# Patient Record
Sex: Female | Born: 1960 | Smoking: Never smoker
Health system: Southern US, Community
[De-identification: ages and names within clinical notes are randomized; demographics above are authoritative.]

## PROBLEM LIST (undated history)

## (undated) DIAGNOSIS — K219 Gastro-esophageal reflux disease without esophagitis: Secondary | ICD-10-CM

## (undated) DIAGNOSIS — Z9889 Other specified postprocedural states: Secondary | ICD-10-CM

## (undated) DIAGNOSIS — R112 Nausea with vomiting, unspecified: Secondary | ICD-10-CM

## (undated) HISTORY — PX: TUBAL LIGATION: SHX77

## (undated) HISTORY — PX: ABDOMINAL HYSTERECTOMY: SHX81

---

## 2005-08-15 HISTORY — PX: ESOPHAGOGASTRODUODENOSCOPY: SHX1529

## 2015-04-13 HISTORY — PX: COLONOSCOPY: SHX174

## 2015-11-09 HISTORY — PX: FLEXIBLE SIGMOIDOSCOPY: SHX5431

## 2016-06-02 DIAGNOSIS — Z8719 Personal history of other diseases of the digestive system: Secondary | ICD-10-CM | POA: Insufficient documentation

## 2017-04-17 HISTORY — PX: FLEXIBLE SIGMOIDOSCOPY: SHX1649

## 2019-04-04 DIAGNOSIS — M6740 Ganglion, unspecified site: Secondary | ICD-10-CM

## 2019-04-04 DIAGNOSIS — R2231 Localized swelling, mass and lump, right upper limb: Secondary | ICD-10-CM

## 2019-04-04 HISTORY — DX: Ganglion, unspecified site: M67.40

## 2019-04-04 HISTORY — DX: Localized swelling, mass and lump, right upper limb: R22.31

## 2019-04-05 ENCOUNTER — Other Ambulatory Visit: Payer: Self-pay | Admitting: Orthopedic Surgery

## 2019-04-11 ENCOUNTER — Other Ambulatory Visit (HOSPITAL_COMMUNITY): Payer: Self-pay

## 2019-04-30 ENCOUNTER — Inpatient Hospital Stay (HOSPITAL_COMMUNITY): Admission: RE | Admit: 2019-04-30 | Payer: Self-pay | Source: Ambulatory Visit

## 2019-05-03 ENCOUNTER — Ambulatory Visit (HOSPITAL_BASED_OUTPATIENT_CLINIC_OR_DEPARTMENT_OTHER): Admit: 2019-05-03 | Payer: Self-pay | Admitting: Orthopedic Surgery

## 2019-05-03 ENCOUNTER — Encounter (HOSPITAL_BASED_OUTPATIENT_CLINIC_OR_DEPARTMENT_OTHER): Payer: Self-pay

## 2019-05-03 SURGERY — CYST REMOVAL
Anesthesia: Regional | Laterality: Right

## 2019-07-15 ENCOUNTER — Other Ambulatory Visit: Payer: Self-pay | Admitting: Orthopedic Surgery

## 2019-07-18 ENCOUNTER — Other Ambulatory Visit (HOSPITAL_COMMUNITY): Payer: BC Managed Care – PPO

## 2019-07-21 ENCOUNTER — Ambulatory Visit (HOSPITAL_BASED_OUTPATIENT_CLINIC_OR_DEPARTMENT_OTHER)
Admission: RE | Admit: 2019-07-21 | Payer: BC Managed Care – PPO | Source: Home / Self Care | Admitting: Orthopedic Surgery

## 2019-07-21 ENCOUNTER — Encounter (HOSPITAL_BASED_OUTPATIENT_CLINIC_OR_DEPARTMENT_OTHER): Admission: RE | Payer: Self-pay | Source: Home / Self Care

## 2019-07-21 SURGERY — Surgical Case
Anesthesia: *Unknown

## 2019-07-21 SURGERY — EXCISION MASS
Anesthesia: Regional | Laterality: Right

## 2019-07-22 ENCOUNTER — Encounter (HOSPITAL_BASED_OUTPATIENT_CLINIC_OR_DEPARTMENT_OTHER): Payer: Self-pay

## 2019-07-22 ENCOUNTER — Other Ambulatory Visit: Payer: Self-pay

## 2019-07-22 NOTE — Progress Notes (Signed)
Pt states she is very hard IV stick when NPO for past procedures. Reviewed with Dr. Gifford Shave, and states that pt is allowed to drink water up until 10 AM on DOS. Ensure is to finished by 10 as well. Pt instructed to call back if surgery time is rescheduled for any reason, to make sure NPO status is still adequate for surgery. Pt verbalized understanding.

## 2019-07-26 ENCOUNTER — Other Ambulatory Visit (HOSPITAL_COMMUNITY)
Admission: RE | Admit: 2019-07-26 | Discharge: 2019-07-26 | Disposition: A | Discharge status: Home / Self Care | Payer: BC Managed Care – PPO | Source: Ambulatory Visit | Attending: Orthopedic Surgery | Admitting: Orthopedic Surgery

## 2019-07-26 DIAGNOSIS — Z20828 Contact with and (suspected) exposure to other viral communicable diseases: Secondary | ICD-10-CM | POA: Diagnosis not present

## 2019-07-26 DIAGNOSIS — Z01812 Encounter for preprocedural laboratory examination: Secondary | ICD-10-CM | POA: Diagnosis not present

## 2019-07-26 NOTE — Progress Notes (Signed)

## 2019-07-27 LAB — NOVEL CORONAVIRUS, NAA (HOSP ORDER, SEND-OUT TO REF LAB; TAT 18-24 HRS): SARS-CoV-2, NAA: NOT DETECTED

## 2019-07-29 ENCOUNTER — Encounter (HOSPITAL_BASED_OUTPATIENT_CLINIC_OR_DEPARTMENT_OTHER)
Admission: RE | Disposition: A | Discharge status: Home / Self Care | Payer: Self-pay | Source: Home / Self Care | Attending: Orthopedic Surgery

## 2019-07-29 ENCOUNTER — Other Ambulatory Visit: Payer: Self-pay

## 2019-07-29 ENCOUNTER — Encounter (HOSPITAL_BASED_OUTPATIENT_CLINIC_OR_DEPARTMENT_OTHER): Payer: Self-pay | Admitting: *Deleted

## 2019-07-29 ENCOUNTER — Ambulatory Visit (HOSPITAL_BASED_OUTPATIENT_CLINIC_OR_DEPARTMENT_OTHER): Payer: BC Managed Care – PPO | Admitting: Certified Registered"

## 2019-07-29 ENCOUNTER — Ambulatory Visit (HOSPITAL_BASED_OUTPATIENT_CLINIC_OR_DEPARTMENT_OTHER)
Admission: RE | Admit: 2019-07-29 | Discharge: 2019-07-29 | Disposition: A | Discharge status: Home / Self Care | Payer: BC Managed Care – PPO | Attending: Orthopedic Surgery | Admitting: Orthopedic Surgery

## 2019-07-29 DIAGNOSIS — M67441 Ganglion, right hand: Secondary | ICD-10-CM | POA: Insufficient documentation

## 2019-07-29 DIAGNOSIS — M19041 Primary osteoarthritis, right hand: Secondary | ICD-10-CM | POA: Insufficient documentation

## 2019-07-29 DIAGNOSIS — E119 Type 2 diabetes mellitus without complications: Secondary | ICD-10-CM | POA: Diagnosis not present

## 2019-07-29 DIAGNOSIS — K219 Gastro-esophageal reflux disease without esophagitis: Secondary | ICD-10-CM | POA: Diagnosis not present

## 2019-07-29 DIAGNOSIS — M25741 Osteophyte, right hand: Secondary | ICD-10-CM | POA: Insufficient documentation

## 2019-07-29 DIAGNOSIS — R2231 Localized swelling, mass and lump, right upper limb: Secondary | ICD-10-CM | POA: Diagnosis present

## 2019-07-29 HISTORY — DX: Nausea with vomiting, unspecified: R11.2

## 2019-07-29 HISTORY — DX: Gastro-esophageal reflux disease without esophagitis: K21.9

## 2019-07-29 HISTORY — PX: I & D EXTREMITY: SHX5045

## 2019-07-29 HISTORY — PX: CYST EXCISION: SHX5701

## 2019-07-29 HISTORY — DX: Other specified postprocedural states: Z98.890

## 2019-07-29 SURGERY — IRRIGATION AND DEBRIDEMENT EXTREMITY
Anesthesia: Monitor Anesthesia Care | Site: Hand | Laterality: Right

## 2019-07-29 MED ORDER — TRAMADOL HCL 50 MG PO TABS: 50.0000 mg | ORAL_TABLET | Freq: Four times a day (QID) | ORAL | 0 refills | Status: DC | PRN

## 2019-07-29 MED ORDER — CEFAZOLIN SODIUM-DEXTROSE 2-4 GM/100ML-% IV SOLN
INTRAVENOUS | Status: AC
  Filled 2019-07-29: qty 100

## 2019-07-29 MED ORDER — LIDOCAINE HCL (PF) 0.5 % IJ SOLN
INTRAMUSCULAR | Status: DC | PRN
  Administered 2019-07-29: 30 mL via INTRAVENOUS

## 2019-07-29 MED ORDER — PROPOFOL 500 MG/50ML IV EMUL
INTRAVENOUS | Status: DC | PRN
  Administered 2019-07-29: 100 ug/kg/min via INTRAVENOUS

## 2019-07-29 MED ORDER — SCOPOLAMINE 1 MG/3DAYS TD PT72
MEDICATED_PATCH | TRANSDERMAL | Status: AC
  Filled 2019-07-29: qty 1

## 2019-07-29 MED ORDER — MIDAZOLAM HCL 5 MG/5ML IJ SOLN
INTRAMUSCULAR | Status: DC | PRN
  Administered 2019-07-29: 2 mg via INTRAVENOUS

## 2019-07-29 MED ORDER — 0.9 % SODIUM CHLORIDE (POUR BTL) OPTIME
TOPICAL | Status: DC | PRN
  Administered 2019-07-29: 15:00:00 1000 mL

## 2019-07-29 MED ORDER — LIDOCAINE HCL (PF) 1 % IJ SOLN
INTRAMUSCULAR | Status: DC | PRN
  Administered 2019-07-29: 30 mL

## 2019-07-29 MED ORDER — ONDANSETRON HCL 4 MG/2ML IJ SOLN
INTRAMUSCULAR | Status: DC | PRN
  Administered 2019-07-29: 4 mg via INTRAVENOUS

## 2019-07-29 MED ORDER — FENTANYL CITRATE (PF) 100 MCG/2ML IJ SOLN
INTRAMUSCULAR | Status: DC | PRN
  Administered 2019-07-29: 50 ug via INTRAVENOUS

## 2019-07-29 MED ORDER — CEFAZOLIN SODIUM-DEXTROSE 2-4 GM/100ML-% IV SOLN
2.0000 g | INTRAVENOUS | Status: AC
  Administered 2019-07-29: 14:00:00 2 g via INTRAVENOUS

## 2019-07-29 MED ORDER — BUPIVACAINE HCL (PF) 0.25 % IJ SOLN
INTRAMUSCULAR | Status: DC | PRN
  Administered 2019-07-29: 30 mL

## 2019-07-29 MED ORDER — CHLORHEXIDINE GLUCONATE 4 % EX LIQD: 60.0000 mL | Freq: Once | CUTANEOUS | Status: DC

## 2019-07-29 MED ORDER — FENTANYL CITRATE (PF) 100 MCG/2ML IJ SOLN
INTRAMUSCULAR | Status: AC
  Filled 2019-07-29: qty 2

## 2019-07-29 MED ORDER — EPHEDRINE SULFATE 50 MG/ML IJ SOLN
INTRAMUSCULAR | Status: DC | PRN
  Administered 2019-07-29: 5 mg via INTRAVENOUS

## 2019-07-29 MED ORDER — MIDAZOLAM HCL 2 MG/2ML IJ SOLN
INTRAMUSCULAR | Status: AC
  Filled 2019-07-29: qty 2

## 2019-07-29 MED ORDER — ONDANSETRON HCL 4 MG/2ML IJ SOLN
INTRAMUSCULAR | Status: AC
  Filled 2019-07-29: qty 2

## 2019-07-29 MED ORDER — SCOPOLAMINE 1 MG/3DAYS TD PT72
1.0000 | MEDICATED_PATCH | Freq: Once | TRANSDERMAL | Status: DC
  Administered 2019-07-29: 1.5 mg via TRANSDERMAL

## 2019-07-29 MED ORDER — LACTATED RINGERS IV SOLN
INTRAVENOUS | Status: DC
  Administered 2019-07-29: 13:00:00 via INTRAVENOUS

## 2019-07-29 SURGICAL SUPPLY — 53 items
BAG DECANTER FOR FLEXI CONT (MISCELLANEOUS) IMPLANT
BLADE MINI RND TIP GREEN BEAV (BLADE) IMPLANT
BLADE SURG 15 STRL LF DISP TIS (BLADE) ×1 IMPLANT
BLADE SURG 15 STRL SS (BLADE) ×2
BNDG COHESIVE 1X5 TAN STRL LF (GAUZE/BANDAGES/DRESSINGS) IMPLANT
BNDG COHESIVE 2X5 TAN STRL LF (GAUZE/BANDAGES/DRESSINGS) IMPLANT
BNDG COHESIVE 3X5 TAN STRL LF (GAUZE/BANDAGES/DRESSINGS) IMPLANT
BNDG ESMARK 4X9 LF (GAUZE/BANDAGES/DRESSINGS) IMPLANT
BNDG GAUZE ELAST 4 BULKY (GAUZE/BANDAGES/DRESSINGS) IMPLANT
CHLORAPREP W/TINT 26 (MISCELLANEOUS) ×3 IMPLANT
CORD BIPOLAR FORCEPS 12FT (ELECTRODE) ×3 IMPLANT
COVER BACK TABLE REUSABLE LG (DRAPES) ×3 IMPLANT
COVER MAYO STAND REUSABLE (DRAPES) ×3 IMPLANT
COVER WAND RF STERILE (DRAPES) IMPLANT
CUFF TOURN SGL QUICK 18X4 (TOURNIQUET CUFF) IMPLANT
DECANTER SPIKE VIAL GLASS SM (MISCELLANEOUS) IMPLANT
DRAIN PENROSE 1/2X12 LTX STRL (WOUND CARE) IMPLANT
DRAPE EXTREMITY T 121X128X90 (DISPOSABLE) ×3 IMPLANT
DRAPE SURG 17X23 STRL (DRAPES) ×3 IMPLANT
GAUZE PACKING IODOFORM 1/4X15 (GAUZE/BANDAGES/DRESSINGS) IMPLANT
GAUZE SPONGE 4X4 12PLY STRL (GAUZE/BANDAGES/DRESSINGS) ×3 IMPLANT
GAUZE XEROFORM 1X8 LF (GAUZE/BANDAGES/DRESSINGS) ×3 IMPLANT
GLOVE BIOGEL PI IND STRL 8.5 (GLOVE) ×1 IMPLANT
GLOVE BIOGEL PI INDICATOR 8.5 (GLOVE) ×2
GLOVE SURG ORTHO 8.0 STRL STRW (GLOVE) ×3 IMPLANT
GOWN STRL REUS W/ TWL LRG LVL3 (GOWN DISPOSABLE) ×1 IMPLANT
GOWN STRL REUS W/TWL LRG LVL3 (GOWN DISPOSABLE) ×2
GOWN STRL REUS W/TWL XL LVL3 (GOWN DISPOSABLE) ×3 IMPLANT
LOOP VESSEL MAXI BLUE (MISCELLANEOUS) IMPLANT
NEEDLE PRECISIONGLIDE 27X1.5 (NEEDLE) IMPLANT
NS IRRIG 1000ML POUR BTL (IV SOLUTION) ×3 IMPLANT
PACK BASIN DAY SURGERY FS (CUSTOM PROCEDURE TRAY) ×3 IMPLANT
PAD CAST 3X4 CTTN HI CHSV (CAST SUPPLIES) IMPLANT
PADDING CAST ABS 3INX4YD NS (CAST SUPPLIES)
PADDING CAST ABS 4INX4YD NS (CAST SUPPLIES) ×2
PADDING CAST ABS COTTON 3X4 (CAST SUPPLIES) IMPLANT
PADDING CAST ABS COTTON 4X4 ST (CAST SUPPLIES) ×1 IMPLANT
PADDING CAST COTTON 3X4 STRL (CAST SUPPLIES)
SPLINT FINGER 3.25 911903 (SOFTGOODS) ×3 IMPLANT
SPLINT PLASTER CAST XFAST 3X15 (CAST SUPPLIES) IMPLANT
SPLINT PLASTER XTRA FASTSET 3X (CAST SUPPLIES)
STOCKINETTE 4X48 STRL (DRAPES) ×3 IMPLANT
SUT ETHILON 4 0 PS 2 18 (SUTURE) IMPLANT
SUT ETHILON 5 0 P 3 18 (SUTURE) ×4
SUT NYLON ETHILON 5-0 P-3 1X18 (SUTURE) ×2 IMPLANT
SUT VIC AB 4-0 P2 18 (SUTURE) IMPLANT
SWAB COLLECTION DEVICE MRSA (MISCELLANEOUS) IMPLANT
SWAB CULTURE ESWAB REG 1ML (MISCELLANEOUS) IMPLANT
SYR BULB 3OZ (MISCELLANEOUS) ×3 IMPLANT
SYR CONTROL 10ML LL (SYRINGE) IMPLANT
TOWEL GREEN STERILE FF (TOWEL DISPOSABLE) ×6 IMPLANT
TUBE FEEDING ENTERAL 5FR 16IN (TUBING) IMPLANT
UNDERPAD 30X36 HEAVY ABSORB (UNDERPADS AND DIAPERS) ×3 IMPLANT

## 2019-07-29 NOTE — Brief Op Note (Signed)
07/29/2019  2:50 PM  PATIENT:  Anne Cook  58 y.o. female  PRE-OPERATIVE DIAGNOSIS:  MUCOID CYST RIGHT SMALL FINGER  POST-OPERATIVE DIAGNOSIS:  MUCOID CYST RIGHT SMALL FINGER  PROCEDURE:  Procedure(s): IRRIGATION AND DEBRIDEMENT EXTREMITY RIGHT SMALL FINGER (Right) CYST REMOVAL (Right)  SURGEON:  Surgeon(s) and Role:    Daryll Brod, MD - Primary  PHYSICIAN ASSISTANT:   ASSISTANTS: none   ANESTHESIA:   local, regional and IV sedation  EBL:  5 mL   BLOOD ADMINISTERED:none  DRAINS: none   LOCAL MEDICATIONS USED:  BUPIVICAINE  and XYLOCAINE   SPECIMEN:  Excision  DISPOSITION OF SPECIMEN:  PATHOLOGY  COUNTS:  YES  TOURNIQUET:   Total Tourniquet Time Documented: Forearm (Right) - 23 minutes Total: Forearm (Right) - 23 minutes   DICTATION: .Viviann Spare Dictation  PLAN OF CARE: Discharge to home after PACU  PATIENT DISPOSITION:  PACU - hemodynamically stable.

## 2019-07-29 NOTE — Op Note (Signed)
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   ASSISTANT: none   ANESTHESIA:  Bier block with sedation and Local   INTRAVENOUS FLUIDS:  Per anesthesia flow sheet.   ESTIMATED BLOOD LOSS:  Minimal.   COMPLICATIONS:  None.   SPECIMENS:   Cyst synovial tissue and osteophytes   TOURNIQUET TIME:    Total Tourniquet Time Documented: Forearm (Right) - 23 minutes Total: Forearm (Right) - 23 minutes    DISPOSITION:  Stable to PACU.   INDICATIONS: Patient is a 58 year old female with a history of mass dorsal aspect distal phalangeal joint right small finger.  This is included but still has a cystic remnant.  Arthritis present distal phalangeal joint ~like to have this surgically excised with debridement of the joint.  Preperi-and postoperative course been discussed along with risk complications.  She is aware there is no guarantee to the surgery the possibility of infection recurrence injury to arteries nerves tendons complete relief of symptoms just possibility of recurrence.  In the preoperative area the patient seen the extremity marked by palpation surgeon antibiotic given  OPERATIVE COURSE: Patient was brought to the operating room forearm-based IV regional anesthetic was carried out without difficulty.  She was prepped using ChloraPrep in the supine position with the right arm free.  A 3-minute dry time was allowed and a timeout taken to confirm patient procedure.  A metacarpal block was given quarter percent 1%  Xylocaine and no 50-50 solution.  A curvilinear incision was made over the distal phalangeal joint carried along the mid lateral line.  Down through subcutaneous tissue in the right small finger.  Bleeders were electrocauterized necessary with bipolar.  The skin distally was undermined the cyst was isolated with a house curettes and a hemostatic rondure.  This was then removed and sent to pathology.  The joint was opened on its radial aspect just volar to the extensor tendon.  Synovectomy performed dorsally and osteophytes removed from the middle phalanx with using the hemostatic rondure and house curette.  Specimen was sent to pathology.  Wound was copiously irrigated with saline.  The skin was then closed with interrupted 5-0 nylon sutures.  A sterile compressive dressing splint to the finger was applied.  Tourniquet was deflated remaining fingers pink.  She was taken to the recovery room for observation in satisfactory condition.  She will be discharged home to return [place] in 1week Tylenol ibuprofen for pain with Ultram as a backup for breakthrough.   Daryll Brod, MD Electronically signed, 07/29/19

## 2019-07-29 NOTE — Discharge Instructions (Addendum)

## 2019-07-29 NOTE — Anesthesia Procedure Notes (Signed)
Anesthesia Regional Block: Bier block (IV Regional)   Pre-Anesthetic Checklist: ,, timeout performed, Correct Patient, Correct Site, Correct Laterality, Correct Procedure, Correct Position, site marked, Risks and benefits discussed, Surgical consent,  Pre-op evaluation,  At surgeon's request  Laterality: Right  Prep: alcohol swabs       Needles:       Needle Gauge: 22     Additional Needles:   Procedures:,,,,,,,, #20gu IV placed  Narrative:  Anesthesiologist: Lillia Abed, MD CRNA: Verita Lamb, CRNA

## 2019-07-29 NOTE — H&P (Signed)
  Anne Cook is an 58 y.o. female.   Chief Complaint:mass right small fingerHPI:Anne Cook is a 58 year old right-hand-dominant female referred by Dr. Alecia Lemming for consultation regarding a mass on the dorsal aspect of her right small finger. This has been present for approximately 3 months. Recalls no history of injury it does not cause any pain it is enlarged. Is not had any treatment for it. Nothing makes it better or worse. She has history of diabetes thyroid problems arthritis gout. Family history is positive arthritis negative for the remainder.   Past Medical History:  Diagnosis Date  . GERD (gastroesophageal reflux disease)   . PONV (postoperative nausea and vomiting)    hx of N/V with tubal    Past Surgical History:  Procedure Laterality Date  . ABDOMINAL HYSTERECTOMY    . TUBAL LIGATION      History reviewed. No pertinent family history. Social History:  reports that she has never smoked. She has never used smokeless tobacco. She reports previous alcohol use. She reports that she does not use drugs.  Allergies:  Allergies  Allergen Reactions  . Amoxicillin Hives    Thrush   . Latex Rash    No medications prior to admission.    No results found for this or any previous visit (from the past 48 hour(s)).  No results found.   Pertinent items are noted in HPI.  Height 5\' 10"  (1.778 m), weight 61.2 kg.  General appearance: alert, cooperative and appears stated age Head: Normocephalic, without obvious abnormality Neck: no JVD Resp: clear to auscultation bilaterally Cardio: regular rate and rhythm, S1, S2 normal, no murmur, click, rub or gallop GI: soft, non-tender; bowel sounds normal; no masses,  no organomegaly Extremities: mass right small finger Pulses: 2+ and symmetric Skin: Skin color, texture, turgor normal. No rashes or lesions Neurologic: Grossly normal Incision/Wound: na  Assessment/Plan Diagnosis mucoid cyst degenerative arthritis distal  phalangeal joint right small finger   Plan: Have discussed with her the etiology of the problem various treatment alternatives. Would recommend excision of the cyst debridement of the distal phalangeal joint. Pre-peri-postoperative course been discussed along with risk and complications. She is aware there is no guarantee to the surgery possibility of infection recurrence injury to arteries nerves tendons complete relief symptoms dystrophy she is aware that as long as the joint is present of a new cyst can form. She would like to proceed and is scheduled for excision mucoid cyst debridement distal phalangeal joint right small finger as an outpatient under regional anesthesia.   Daryll Brod 07/29/2019, 5:37 AM

## 2019-07-29 NOTE — Transfer of Care (Signed)
Immediate Anesthesia Transfer of Care Note  Patient: Anne Cook  Procedure(s) Performed: IRRIGATION AND DEBRIDEMENT EXTREMITY RIGHT SMALL FINGER (Right Hand) CYST REMOVAL (Right Hand)  Patient Location: PACU  Anesthesia Type:MAC and Bier block  Level of Consciousness: awake, alert  and oriented  Airway & Oxygen Therapy: Patient Spontanous Breathing and Patient connected to face mask oxygen  Post-op Assessment: Report given to RN and Post -op Vital signs reviewed and stable  Post vital signs: Reviewed and stable  Last Vitals:  Vitals Value Taken Time  BP 119/58 07/29/19 1453  Temp    Pulse 64 07/29/19 1454  Resp 16 07/29/19 1454  SpO2 100 % 07/29/19 1454  Vitals shown include unvalidated device data.  Last Pain:  Vitals:   07/29/19 1254  TempSrc: Oral  PainSc: 0-No pain      Patients Stated Pain Goal: 0 (16/10/96 0454)  Complications: No apparent anesthesia complications

## 2019-07-29 NOTE — Anesthesia Postprocedure Evaluation (Signed)
Anesthesia Post Note  Patient: Anne Cook  Procedure(s) Performed: IRRIGATION AND DEBRIDEMENT EXTREMITY RIGHT SMALL FINGER (Right Hand) CYST REMOVAL (Right Hand)     Patient location during evaluation: PACU Anesthesia Type: MAC Level of consciousness: awake and alert and patient cooperative Pain management: pain level controlled Vital Signs Assessment: post-procedure vital signs reviewed and stable Respiratory status: spontaneous breathing and respiratory function stable Cardiovascular status: stable Anesthetic complications: no    Last Vitals:  Vitals:   07/29/19 1254 07/29/19 1453  BP: 137/81 (!) 119/58  Pulse: 64 62  Resp: 18 15  Temp: 37.2 C 36.5 C  SpO2: 100% 100%    Last Pain:  Vitals:   07/29/19 1500  TempSrc:   PainSc: 0-No pain                 Laval Cafaro DAVID

## 2019-07-29 NOTE — Anesthesia Preprocedure Evaluation (Signed)
Anesthesia Evaluation  Patient identified by MRN, date of birth, ID band Patient awake    Reviewed: Allergy & Precautions, NPO status , Patient's Chart, lab work & pertinent test results  History of Anesthesia Complications (+) PONV  Airway Mallampati: I  TM Distance: >3 FB Neck ROM: Full    Dental   Pulmonary    Pulmonary exam normal        Cardiovascular Normal cardiovascular exam     Neuro/Psych    GI/Hepatic GERD  Medicated and Controlled,  Endo/Other    Renal/GU      Musculoskeletal   Abdominal   Peds  Hematology   Anesthesia Other Findings   Reproductive/Obstetrics                             Anesthesia Physical Anesthesia Plan  ASA: II  Anesthesia Plan: MAC   Post-op Pain Management:    Induction: Intravenous  PONV Risk Score and Plan: 3 and Treatment may vary due to age or medical condition, Ondansetron and Midazolam  Airway Management Planned: Simple Face Mask  Additional Equipment:   Intra-op Plan:   Post-operative Plan:   Informed Consent: I have reviewed the patients History and Physical, chart, labs and discussed the procedure including the risks, benefits and alternatives for the proposed anesthesia with the patient or authorized representative who has indicated his/her understanding and acceptance.       Plan Discussed with: CRNA and Surgeon  Anesthesia Plan Comments:         Anesthesia Quick Evaluation

## 2019-08-01 ENCOUNTER — Encounter (HOSPITAL_BASED_OUTPATIENT_CLINIC_OR_DEPARTMENT_OTHER): Payer: Self-pay | Admitting: Orthopedic Surgery

## 2019-08-01 LAB — SURGICAL PATHOLOGY

## 2020-02-01 ENCOUNTER — Ambulatory Visit: Payer: BC Managed Care – PPO | Admitting: Sports Medicine

## 2020-02-01 ENCOUNTER — Encounter: Payer: Self-pay | Admitting: Sports Medicine

## 2020-02-01 ENCOUNTER — Other Ambulatory Visit: Payer: Self-pay

## 2020-02-01 ENCOUNTER — Other Ambulatory Visit: Payer: Self-pay | Admitting: Sports Medicine

## 2020-02-01 ENCOUNTER — Ambulatory Visit (INDEPENDENT_AMBULATORY_CARE_PROVIDER_SITE_OTHER): Payer: BC Managed Care – PPO

## 2020-02-01 DIAGNOSIS — M778 Other enthesopathies, not elsewhere classified: Secondary | ICD-10-CM | POA: Diagnosis not present

## 2020-02-01 DIAGNOSIS — M779 Enthesopathy, unspecified: Secondary | ICD-10-CM | POA: Diagnosis not present

## 2020-02-01 DIAGNOSIS — M79671 Pain in right foot: Secondary | ICD-10-CM

## 2020-02-01 NOTE — Progress Notes (Signed)
Subjective: Anne Cook is a 59 y.o. female patient who presents to office for evaluation of right foot pain. Patient complains of a little soreness on the lateral side of her right foot for the last 6 months reports that she does a lot of hiking and biking does not recall any type of injury but does notice that she has pain after the end of her day at work or after activity reports that she also noticed some pain when she goes from sitting to standing up on the lateral side of the right foot pain is 2 out of 10 has tried icing and also has not noticed a difference when she has changed her shoes which she does on a regular basis due to her increased mileage with hiking.  No other pedal complaints noted.  Review of Systems  All other systems reviewed and are negative.     Patient Active Problem List   Diagnosis Date Noted  . Mass of finger of right hand 04/04/2019  . Mucoid cyst, joint 04/04/2019  . History of gastroesophageal reflux (GERD) 06/02/2016    Current Outpatient Medications on File Prior to Visit  Medication Sig Dispense Refill  . Melatonin 1 MG CAPS Take 1 capsule by mouth at bedtime.    . Multiple Vitamin (MULTIVITAMIN) capsule Take 1 capsule by mouth every other day.    Marland Kitchen omeprazole (PRILOSEC) 20 MG capsule Take 20 mg by mouth daily.    . vitamin B-12 (CYANOCOBALAMIN) 100 MCG tablet Take 100 mcg by mouth every other day.     No current facility-administered medications on file prior to visit.    Allergies  Allergen Reactions  . Amoxicillin Hives    Thrush   . Latex Rash    Objective:  General: Alert and oriented x3 in no acute distress  Dermatology: No open lesions bilateral lower extremities, no webspace macerations, no ecchymosis bilateral, all nails x 10 are well manicured.  Vascular: Dorsalis Pedis and Posterior Tibial pedal pulses palpable, Capillary Fill Time 3 seconds,(+) pedal hair growth bilateral, no edema bilateral lower extremities, Temperature  gradient within normal limits.  Neurology: Michaell Cowing sensation intact via light touch bilateral.  Musculoskeletal: Mild tenderness with palpation at right fifth metatarsal base at peroneal brevis insertion, mild hypermobility noted along the first ray with enlarged base/dorsal prominence at midfoot cuneiform joint right greater than left,No pain with calf compression bilateral, range of motion within normal limits bilateral except as mentioned above with hypermobility.  Strength within normal limits in all groups bilateral.   Xrays  Right foot   Impression: No fracture or dislocation, hypermobile foot type, increased prominence at the mid cuneiform base however no major signs of arthritis, soft tissue swelling within normal limits.  No other acute findings.  Assessment and Plan: Problem List Items Addressed This Visit    None    Visit Diagnoses    Tendinitis    -  Primary   Right foot pain           -Complete examination performed -Xrays reviewed -Discussed treatment options for likely tendinitis -Patient was recommended to have a ankle gauntlet for her Tri-Lock brace patient was fitted for both braces this visit to use while biking or hiking however at this time patient declined to take them -Recommend rest ice elevation and continue with good supportive shoes -Advised patient to continue activities to tolerance with protection -Advised topical anti-inflammatory of pain patch as needed -Patient to return to office as needed or sooner if  condition worsens.  Landis Martins, DPM

## 2020-02-07 ENCOUNTER — Telehealth: Payer: Self-pay

## 2020-02-07 DIAGNOSIS — M79671 Pain in right foot: Secondary | ICD-10-CM

## 2020-02-07 DIAGNOSIS — M779 Enthesopathy, unspecified: Secondary | ICD-10-CM

## 2020-02-07 NOTE — Telephone Encounter (Signed)
PT order for [facility] Val can you help [place] with sending a order for PT for this patient since we have never use [facility] before THanks Dr. Kathie Rhodes

## 2020-02-07 NOTE — Telephone Encounter (Signed)
Pt called requesting a PT order for [facility] for her pain.

## 2020-02-08 NOTE — Telephone Encounter (Signed)
Faxed required form, clinical and demographics to [facility].

## 2020-02-08 NOTE — Telephone Encounter (Signed)
Thank you Valery! :)

## 2020-02-08 NOTE — Addendum Note (Signed)
Addended by: Alphia Kava D on: 02/08/2020 09:36 AM   Modules accepted: Orders

## 2020-02-10 ENCOUNTER — Other Ambulatory Visit: Payer: Self-pay | Admitting: Sports Medicine

## 2020-02-10 DIAGNOSIS — M779 Enthesopathy, unspecified: Secondary | ICD-10-CM

## 2020-10-29 DIAGNOSIS — K219 Gastro-esophageal reflux disease without esophagitis: Secondary | ICD-10-CM | POA: Insufficient documentation

## 2020-10-29 DIAGNOSIS — Z9889 Other specified postprocedural states: Secondary | ICD-10-CM | POA: Insufficient documentation

## 2020-10-31 ENCOUNTER — Encounter: Payer: Self-pay | Admitting: Cardiology

## 2020-10-31 ENCOUNTER — Encounter: Payer: Self-pay | Admitting: *Deleted

## 2020-10-31 DIAGNOSIS — E785 Hyperlipidemia, unspecified: Secondary | ICD-10-CM

## 2020-10-31 HISTORY — DX: Hyperlipidemia, unspecified: E78.5

## 2020-11-02 NOTE — Progress Notes (Signed)
Cardiology Office Note:    Date:  11/05/2020   ID:  [name], DOB [birth year], MRN [id number]  PCP:  Lise Auer, MD  Cardiologist:  Norman Herrlich, MD   Referring MD: Lise Auer, MD  ASSESSMENT:    1. Cardiac risk counseling   2. Pure hypercholesterolemia   3. Family history of early CAD    PLAN:    In order of problems listed above:  1. Her CAD risk is low from the ASCVD calculator however it does not take into consideration family history.  After discussion we will do a cardiac calcium score to recalculate her risk if high risk of statin is absolutely necessary for intermediates of personal decision and she had high risk score I will consider the merits of a cardiac CTA. 2. We will make a decision to follow-up after seeing the results of the calcium score  Next appointment to be determined   Medication Adjustments/Labs and Tests Ordered: Current medicines are reviewed at length with the patient today.  Concerns regarding medicines are outlined above.  No orders of the defined types were placed in this encounter.  No orders of the defined types were placed in this encounter.    No chief complaint on file.   History of Present Illness:    Anne Cook is a 60 y.o. female who is being seen today for the evaluation of family history of CAD at the request of Lise Auer, MD. I reviewed epic and care everywhere and there were no cardiac notes or imaging reports available. Most recent labs 09/04/2020: Cholesterol 217 LDL 150 triglycerides 123 HDL 42 non-HDL cholesterol quite elevated 165.  Hemoglobin 13.4 01/31/2020 CMP shows potassium 4.2 GFR 80 cc normal liver function test  She has a strong family history of CAD Father died age 67 following myocardial infarction Paternal grandfather died age 81 was told was heart Maternal grandmother died later in life unspecified heart disease She has a history of previous chest pain and a normal treadmill EKG in  2017. She has moderately elevated lipids and is concerned about her personal risk and whether or not she should except a statin is hesitant to take 1. From the ASCVD calculator her risk is low 2.5%. She has no history of hypertension diabetes cigarette smoking.  Past Medical History:  Diagnosis Date  . GERD (gastroesophageal reflux disease)   . Hyperlipidemia 10/31/2020  . Mass of finger of right hand 04/04/2019  . Mucoid cyst, joint 04/04/2019  . PONV (postoperative nausea and vomiting)    hx of N/V with tubal    Past Surgical History:  Procedure Laterality Date  . ABDOMINAL HYSTERECTOMY    . CYST EXCISION Right 07/29/2019   Procedure: CYST REMOVAL;  Surgeon: Cindee Salt, MD;  Location: [facility];  Service: Orthopedics;  Laterality: Right;  . I & D EXTREMITY Right 07/29/2019   Procedure: IRRIGATION AND DEBRIDEMENT EXTREMITY RIGHT SMALL FINGER;  Surgeon: Cindee Salt, MD;  Location: [facility];  Service: Orthopedics;  Laterality: Right;  . TUBAL LIGATION      Current Medications: Current Meds  Medication Sig  . Biotin 1000 MCG CHEW Chew 1 tablet by mouth 2 (two) times a week.  . Chromium Picolinate (CHROMIUM PICOLATE PO) Take 1 capsule by mouth once a week.  . loratadine (CLARITIN) 10 MG tablet Take 10 mg by mouth daily.  . metroNIDAZOLE (METROGEL) 1 % gel Apply 1 application topically daily. Apply daily on nose  .  Multiple Vitamin (MULTIVITAMIN) capsule Take 1 capsule by mouth every other day.  Marland Kitchen omeprazole (PRILOSEC) 20 MG capsule Take 20 mg by mouth daily.  . vitamin B-12 (CYANOCOBALAMIN) 100 MCG tablet Take 100 mcg by mouth every other day.     Allergies:   Amoxicillin and Latex   Social History   Socioeconomic History  . Marital status: Unknown    Spouse name: Not on file  . Number of children: Not on file  . Years of education: Not on file  . Highest education level: Not on file  Occupational History  . Not on file  Tobacco Use  .  Smoking status: Never Smoker  . Smokeless tobacco: Never Used  Vaping Use  . Vaping Use: Never used  Substance and Sexual Activity  . Alcohol use: Not Currently    Comment: rarely  . Drug use: Never  . Sexual activity: Not on file  Other Topics Concern  . Not on file  Social History Narrative  . Not on file   Social Determinants of Health   Financial Resource Strain: Not on file  Food Insecurity: Not on file  Transportation Needs: Not on file  Physical Activity: Not on file  Stress: Not on file  Social Connections: Not on file     Family History: The patient's family history includes Arthritis in her mother; Cancer in her sister; Cerebrovascular Disease in her maternal grandfather; Coronary artery disease in her father; Heart attack in her father; Heart disease in her paternal grandfather and paternal grandmother; Hyperlipidemia in her father; Melanoma in her mother; Stroke in her father.  ROS:   ROS Please see the history of present illness.      EKGs/Labs/Other Studies Reviewed:    The following studies were reviewed today:   EKG:  EKG is  ordered today.  The ekg ordered today is personally reviewed and demonstrates this rhythm baseline artifact    Physical Exam:    VS:  BP 112/62   Pulse 71   Ht 5\' 10"  (1.778 m)   Wt 134 lb (60.8 kg)   SpO2 98%   BMI 19.23 kg/m     Wt Readings from Last 3 Encounters:  11/05/20 134 lb (60.8 kg)  10/01/20 134 lb (60.8 kg)  07/29/19 134 lb 0.6 oz (60.8 kg)     GEN:  Well nourished, well developed in no acute distress she has no xanthoma or xanthelasma HEENT: Normal NECK: No JVD; No carotid bruits LYMPHATICS: No lymphadenopathy CARDIAC: RRR, no murmurs, rubs, gallops RESPIRATORY:  Clear to auscultation without rales, wheezing or rhonchi  ABDOMEN: Soft, non-tender, non-distended MUSCULOSKELETAL:  No edema; No deformity  SKIN: Warm and dry NEUROLOGIC:  Alert and oriented x 3 PSYCHIATRIC:  Normal affect      Signed, 07/31/19, MD  11/05/2020 2:32 PM    [facility]

## 2020-11-05 ENCOUNTER — Encounter: Payer: Self-pay | Admitting: Cardiology

## 2020-11-05 ENCOUNTER — Ambulatory Visit (INDEPENDENT_AMBULATORY_CARE_PROVIDER_SITE_OTHER): Payer: BC Managed Care – PPO | Admitting: Cardiology

## 2020-11-05 ENCOUNTER — Other Ambulatory Visit: Payer: Self-pay

## 2020-11-05 VITALS — BP 112/62 | HR 71 | Ht 70.0 in | Wt 134.0 lb

## 2020-11-05 DIAGNOSIS — Z7189 Other specified counseling: Secondary | ICD-10-CM

## 2020-11-05 DIAGNOSIS — E7800 Pure hypercholesterolemia, unspecified: Secondary | ICD-10-CM | POA: Diagnosis not present

## 2020-11-05 DIAGNOSIS — Z8249 Family history of ischemic heart disease and other diseases of the circulatory system: Secondary | ICD-10-CM

## 2020-11-05 NOTE — Patient Instructions (Signed)
Medication Instructions:  Your physician recommends that you continue on your current medications as directed. Please refer to the Current Medication list given to you today.  *If you need a refill on your cardiac medications before your next appointment, please call your pharmacy*   Lab Work: None If you have labs (blood work) drawn today and your tests are completely normal, you will receive your results only by: Marland Kitchen MyChart Message (if you have MyChart) OR . A paper copy in the mail If you have any lab test that is abnormal or we need to change your treatment, we will call you to review the results.   Testing/Procedures: We have put in the order for you to have a CT calcium score. They will call you to schedule this appointment.    Follow-Up: At [facility], you and your health needs are our priority.  As part of our continuing mission to provide you with exceptional heart care, we have created designated Provider Care Teams.  These Care Teams include your primary Cardiologist (physician) and Advanced Practice Providers (APPs -  Physician Assistants and Nurse Practitioners) who all work together to provide you with the care you need, when you need it.  We recommend signing up for the patient portal called "MyChart".  Sign up information is provided on this After Visit Summary.  MyChart is used to connect with patients for Virtual Visits (Telemedicine).  Patients are able to view lab/test results, encounter notes, upcoming appointments, etc.  Non-urgent messages can be sent to your provider as well.   To learn more about what you can do with MyChart, go to [email or web address].    Your next appointment:   As needed  The format for your next appointment:   In Person  Provider:   Norman Herrlich, MD   Other Instructions

## 2020-11-21 ENCOUNTER — Other Ambulatory Visit: Payer: Self-pay

## 2020-11-21 ENCOUNTER — Ambulatory Visit (INDEPENDENT_AMBULATORY_CARE_PROVIDER_SITE_OTHER)
Admission: RE | Admit: 2020-11-21 | Discharge: 2020-11-21 | Disposition: A | Discharge status: Home / Self Care | Payer: Self-pay | Source: Ambulatory Visit | Attending: Cardiology | Admitting: Cardiology

## 2020-11-21 DIAGNOSIS — Z8249 Family history of ischemic heart disease and other diseases of the circulatory system: Secondary | ICD-10-CM

## 2020-11-21 DIAGNOSIS — Z7189 Other specified counseling: Secondary | ICD-10-CM

## 2020-11-21 DIAGNOSIS — E7800 Pure hypercholesterolemia, unspecified: Secondary | ICD-10-CM

## 2020-11-22 ENCOUNTER — Telehealth: Payer: Self-pay

## 2020-11-22 ENCOUNTER — Inpatient Hospital Stay: Admission: RE | Admit: 2020-11-22 | Payer: BC Managed Care – PPO | Source: Ambulatory Visit

## 2020-11-22 NOTE — Telephone Encounter (Signed)
Patient returning Morgan's call.  

## 2020-11-22 NOTE — Telephone Encounter (Signed)
Spoke with patient regarding results and recommendation.  Patient verbalizes understanding and is agreeable to plan of care. Advised patient to call back with any issues or concerns.  

## 2020-11-22 NOTE — Telephone Encounter (Signed)
Left message on patients voicemail to please return our call.   

## 2021-02-07 ENCOUNTER — Telehealth: Payer: Self-pay | Admitting: Oncology

## 2021-02-07 NOTE — Telephone Encounter (Signed)
Patient referred by Dr Alison Murray for Leukocytosis.  Appt made for 02/26/21 Labs 3:45 pm - Consult 4:15 pm

## 2021-02-25 ENCOUNTER — Other Ambulatory Visit: Payer: Self-pay | Admitting: Oncology

## 2021-02-25 DIAGNOSIS — D72819 Decreased white blood cell count, unspecified: Secondary | ICD-10-CM

## 2021-02-25 NOTE — Progress Notes (Signed)
[facility]  [address] [place],  [place]  [address] [phone]  Clinic Day:  02/26/2021  Referring physician: Lise Auer, MD   HISTORY OF PRESENT ILLNESS:  The patient is a 60 y.o. female who I was asked to consult upon for leukopenia.  Recent labs showed a low white count of 3.6. The patient brings in a record of her CBC's dating back to 2015 to where her white count has fluctuated between 3.2 and 5.7. She denies being placed on any new medications.  Of note, she has been on omeprazole chronically.  She denies having any B symptoms which concern her for her leukopenia being due to an underlying hematologic malignancy.  She denies having any joint swelling which concerns her for her leukopenia being due to an underlying rheumatologic process.  To her knowledge, her sister has a low white count, but she claims she is on a medication that is causing this.  Clinically, the patient claims she has been doing well and denies having any particular changes in her health which could explain her leukopenia.    PAST MEDICAL HISTORY:   Past Medical History:  Diagnosis Date  . GERD (gastroesophageal reflux disease)   . Hyperlipidemia 10/31/2020  . Mass of finger of right hand 04/04/2019  . Mucoid cyst, joint 04/04/2019  . PONV (postoperative nausea and vomiting)    hx of N/V with tubal    PAST SURGICAL HISTORY:   Past Surgical History:  Procedure Laterality Date  . ABDOMINAL HYSTERECTOMY    . CYST EXCISION Right 07/29/2019   Procedure: CYST REMOVAL;  Surgeon: Cindee Salt, MD;  Location: [facility];  Service: Orthopedics;  Laterality: Right;  . I & D EXTREMITY Right 07/29/2019   Procedure: IRRIGATION AND DEBRIDEMENT EXTREMITY RIGHT SMALL FINGER;  Surgeon: Cindee Salt, MD;  Location: [facility];  Service: Orthopedics;  Laterality: Right;  . TUBAL LIGATION      CURRENT MEDICATIONS:   Current Outpatient Medications   Medication Sig Dispense Refill  . azelastine (ASTELIN) 0.1 % nasal spray Place 1 spray into both nostrils daily. Use in each nostril as directed (Patient not taking: Reported on 11/05/2020)    . Biotin 1000 MCG CHEW Chew 1 tablet by mouth 2 (two) times a week.    . Chromium Picolinate (CHROMIUM PICOLATE PO) Take 1 capsule by mouth once a week.    . estradiol (CLIMARA - DOSED IN MG/24 HR) 0.05 mg/24hr patch See admin instructions. (Patient not taking: Reported on 11/05/2020)    . loratadine (CLARITIN) 10 MG tablet Take 10 mg by mouth daily.    . metroNIDAZOLE (METROGEL) 1 % gel Apply 1 application topically daily. Apply daily on nose    . Multiple Vitamin (MULTIVITAMIN) capsule Take 1 capsule by mouth every other day.    Marland Kitchen omeprazole (PRILOSEC) 20 MG capsule Take 20 mg by mouth daily.    . vitamin B-12 (CYANOCOBALAMIN) 100 MCG tablet Take 100 mcg by mouth every other day.     No current facility-administered medications for this visit.    ALLERGIES:   Allergies  Allergen Reactions  . Amoxicillin Hives    Thrush   . Latex Rash    FAMILY HISTORY:   Family History  Problem Relation Age of Onset  . Arthritis Mother   . Melanoma Mother   . Stroke Father   . Hyperlipidemia Father   . Coronary artery disease Father   . Heart attack Father   .  Cancer Sister   . Heart disease Paternal Grandmother   . Heart disease Paternal Grandfather   . Cerebrovascular Disease Maternal Grandfather   Her sister has a history of skin cancer and medication-induced leukopenia  SOCIAL HISTORY:  The patient was born and raised in [place], [place].  She lives in town with her husband of 30 years.  She has no children.  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  There is no history of tobacco or alcohol abuse.    REVIEW OF SYSTEMS:  Review of Systems  Constitutional: Negative for fatigue and fever.  HENT:   Negative for hearing loss and sore throat.   Eyes: Negative for eye  problems.  Respiratory: Negative for chest tightness, cough and hemoptysis.   Cardiovascular: Negative for chest pain and palpitations.  Gastrointestinal: Negative for abdominal distention, abdominal pain, blood in stool, constipation, diarrhea, nausea and vomiting.  Endocrine: Negative for hot flashes.  Genitourinary: Negative for difficulty urinating, dysuria, frequency, hematuria and nocturia.   Musculoskeletal: Negative for arthralgias, back pain, gait problem and myalgias.  Skin: Negative.  Negative for itching and rash.  Neurological: Negative.  Negative for dizziness, extremity weakness, gait problem, headaches, light-headedness and numbness.  Hematological: Negative.   Psychiatric/Behavioral: Negative.  Negative for depression and suicidal ideas. The patient is not nervous/anxious.      PHYSICAL EXAM:  Blood pressure 111/71, pulse 78, temperature 98.5 F (36.9 C), resp. rate 16, height 5\' 9"  (1.753 m), weight 135 lb 1.6 oz (61.3 kg), SpO2 94 %. Wt Readings from Last 3 Encounters:  02/26/21 135 lb 1.6 oz (61.3 kg)  11/05/20 134 lb (60.8 kg)  10/01/20 134 lb (60.8 kg)   Body mass index is 19.95 kg/m. Performance status (ECOG): 0 - Asymptomatic Physical Exam Constitutional:      Appearance: Normal appearance. She is not ill-appearing.  HENT:     Mouth/Throat:     Mouth: Mucous membranes are moist.     Pharynx: Oropharynx is clear. No oropharyngeal exudate or posterior oropharyngeal erythema.  Cardiovascular:     Rate and Rhythm: Normal rate and regular rhythm.     Heart sounds: No murmur heard. No friction rub. No gallop.   Pulmonary:     Effort: Pulmonary effort is normal. No respiratory distress.     Breath sounds: Normal breath sounds. No wheezing, rhonchi or rales.  Chest:  Breasts:     Right: No axillary adenopathy or supraclavicular adenopathy.     Left: No axillary adenopathy or supraclavicular adenopathy.    Abdominal:     General: Bowel sounds are normal.  There is no distension.     Palpations: Abdomen is soft. There is no mass.     Tenderness: There is no abdominal tenderness.  Musculoskeletal:        General: No swelling.     Right lower leg: No edema.     Left lower leg: No edema.  Lymphadenopathy:     Cervical: No cervical adenopathy.     Upper Body:     Right upper body: No supraclavicular or axillary adenopathy.     Left upper body: No supraclavicular or axillary adenopathy.     Lower Body: No right inguinal adenopathy. No left inguinal adenopathy.  Skin:    General: Skin is warm.     Coloration: Skin is not jaundiced.     Findings: No lesion or rash.  Neurological:     General: No focal deficit present.  Mental Status: She is alert and oriented to person, place, and time. Mental status is at baseline.     Cranial Nerves: Cranial nerves are intact.  Psychiatric:        Mood and Affect: Mood normal.        Behavior: Behavior normal.        Thought Content: Thought content normal.    LABS:    CMP Latest Ref Rng & Units 02/26/2021  BUN 4 - 21 21  Creatinine 0.5 - 1.1 0.9  Sodium 137 - 147 137  Potassium 3.4 - 5.3 3.8  Chloride 99 - 108 104  CO2 13 - 22 29(A)  Calcium 8.7 - 10.7 8.8  Alkaline Phos 25 - 125 61  AST 13 - 35 28  ALT 7 - 35 16   ASSESSMENT & PLAN:  A 60 y.o. female who I was asked to consult upon for leukopenia.  I am pleased as her white count is completely normal today.  Furthermore, her white count differential is normal.  Her hemoglobin and platelets are also normal.  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  There is also the slight chance her omeprazole may be factoring into her leukopenia.  As it is the only proton pump inhibitor (PPI) that has the ability to affect blood counts, if her white count ever falls below 3, it may be worth switching her to a different PPI and seeing if this leads to a normalization of her leukopenia.  Overall, I do not get the sense an ominous  hematologic process is present.  I do feel comfortable turning her care back over to her primary care office with the recommendation that her CBC be checked, at most, 2-3 times per year.  I would not have a problem seeing her in the future if new hematologic issues arise that require repeat clinical assessment.  The patient understands all the plans discussed today and is in agreement with them.  I do appreciate Lise Auer, MD for his new consult.   Atarah Cadogan Kirby Funk, MD

## 2021-02-26 ENCOUNTER — Other Ambulatory Visit: Payer: Self-pay | Admitting: Oncology

## 2021-02-26 ENCOUNTER — Other Ambulatory Visit: Payer: Self-pay

## 2021-02-26 ENCOUNTER — Other Ambulatory Visit: Payer: Self-pay | Admitting: Hematology and Oncology

## 2021-02-26 ENCOUNTER — Inpatient Hospital Stay (INDEPENDENT_AMBULATORY_CARE_PROVIDER_SITE_OTHER): Payer: BC Managed Care – PPO | Admitting: Oncology

## 2021-02-26 ENCOUNTER — Inpatient Hospital Stay: Payer: BC Managed Care – PPO | Attending: Oncology

## 2021-02-26 DIAGNOSIS — K219 Gastro-esophageal reflux disease without esophagitis: Secondary | ICD-10-CM | POA: Diagnosis not present

## 2021-02-26 DIAGNOSIS — D72819 Decreased white blood cell count, unspecified: Secondary | ICD-10-CM | POA: Diagnosis not present

## 2021-02-26 DIAGNOSIS — Z79899 Other long term (current) drug therapy: Secondary | ICD-10-CM | POA: Diagnosis not present

## 2021-02-26 DIAGNOSIS — Z808 Family history of malignant neoplasm of other organs or systems: Secondary | ICD-10-CM | POA: Diagnosis not present

## 2021-02-26 DIAGNOSIS — Z862 Personal history of diseases of the blood and blood-forming organs and certain disorders involving the immune mechanism: Secondary | ICD-10-CM | POA: Insufficient documentation

## 2021-02-26 DIAGNOSIS — Z832 Family history of diseases of the blood and blood-forming organs and certain disorders involving the immune mechanism: Secondary | ICD-10-CM | POA: Diagnosis not present

## 2021-02-26 LAB — CBC AND DIFFERENTIAL
HCT: 39 (ref 36–46)
Hemoglobin: 13.3 (ref 12.0–16.0)
Neutrophils Absolute: 2.75
Platelets: 224 (ref 150–399)
WBC: 5.5

## 2021-02-26 LAB — HEPATIC FUNCTION PANEL
ALT: 16 (ref 7–35)
AST: 28 (ref 13–35)
Alkaline Phosphatase: 61 (ref 25–125)
Bilirubin, Total: 0.3

## 2021-02-26 LAB — CBC
MCV: 89 (ref 81–99)
RBC: 4.39 (ref 3.87–5.11)

## 2021-02-26 LAB — BASIC METABOLIC PANEL
BUN: 21 (ref 4–21)
CO2: 29 — AB (ref 13–22)
Chloride: 104 (ref 99–108)
Creatinine: 0.9 (ref 0.5–1.1)
Glucose: 105
Potassium: 3.8 (ref 3.4–5.3)
Sodium: 137 (ref 137–147)

## 2021-02-26 LAB — COMPREHENSIVE METABOLIC PANEL
Albumin: 4.2 (ref 3.5–5.0)
Calcium: 8.8 (ref 8.7–10.7)

## 2021-02-27 LAB — FOLATE: Folate: 12.5 ng/mL (ref >5.9)

## 2021-02-27 LAB — VITAMIN B12: Vitamin B-12: 726 pg/mL (ref 180–914)

## 2021-03-01 ENCOUNTER — Telehealth: Payer: Self-pay | Admitting: Oncology

## 2021-03-01 NOTE — Telephone Encounter (Signed)
Per 5/17 Office Note, patient care turned back over to PCP

## 2021-03-15 ENCOUNTER — Encounter: Payer: Self-pay | Admitting: Oncology

## 2021-09-16 IMAGING — CT CT CARDIAC CORONARY ARTERY CALCIUM SCORE
3 series · 14 of 20 positions shown, 15 images · non-contrast
Comparison: None.
COMPARISON: None.

Addendum:
EXAM:
OVER-READ INTERPRETATION  CT CHEST

The following report is an over-read performed by radiologist Dr.
over-read does not include interpretation of cardiac or coronary
anatomy or pathology. The coronary calcium score interpretation by
the cardiologist is attached.
CLINICAL DATA: Risk stratification
Coronary Calcium Score
TECHNIQUE: The patient was scanned on a Siemens Force scanner. Axial
non-contrast 3 mm slices were carried out through the heart. The
data set was analyzed on a dedicated work station and scored using
the Agatson method.

[Series 2: casc 3.0 bv41 2 bestdiast 72 % · axial · 0.34mm/px · z∈[-209,-137]mm · 4 of 42 slices shown, 5 images]
[im 9/42  vessel]
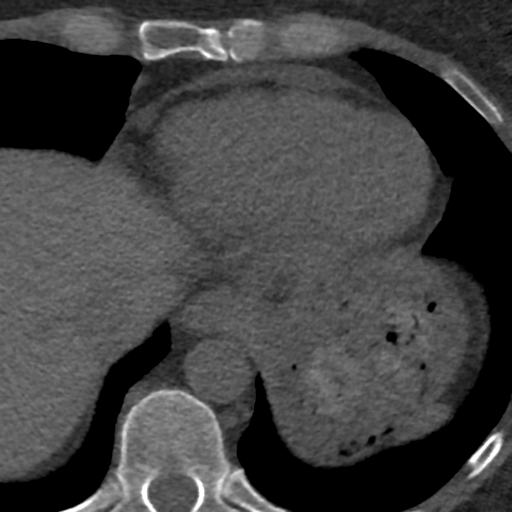
[im 9/42  lung]
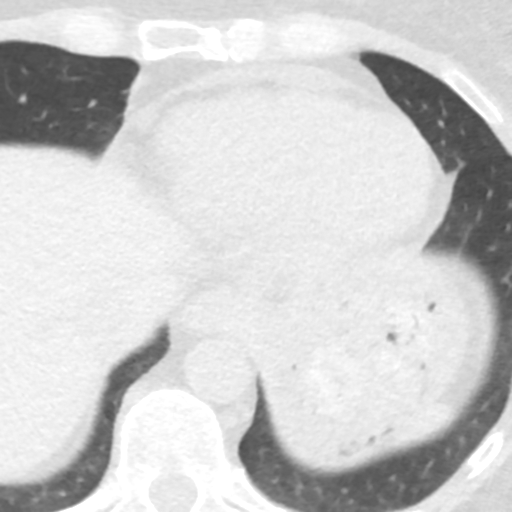
[im 17/42  vessel]
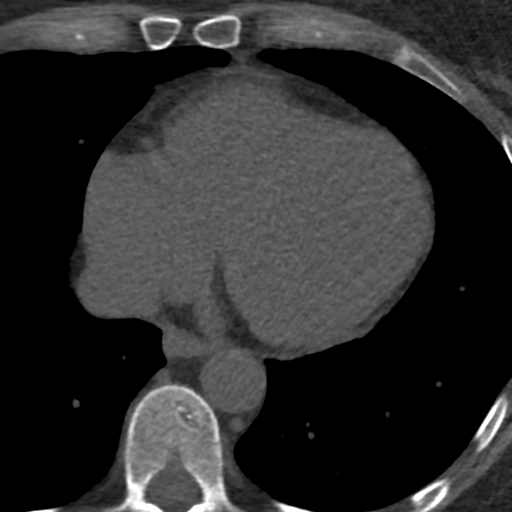
[im 25/42  vessel]
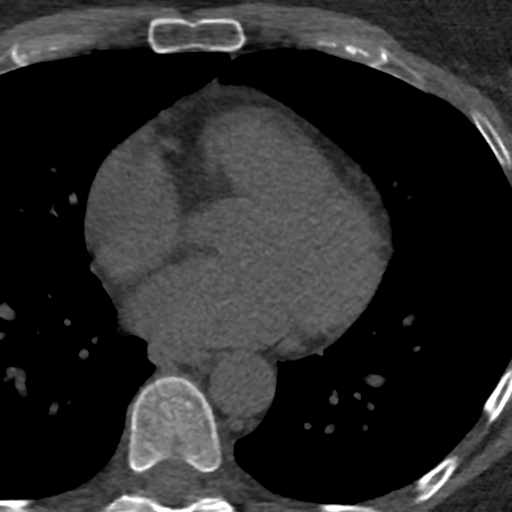
[im 33/42  vessel]
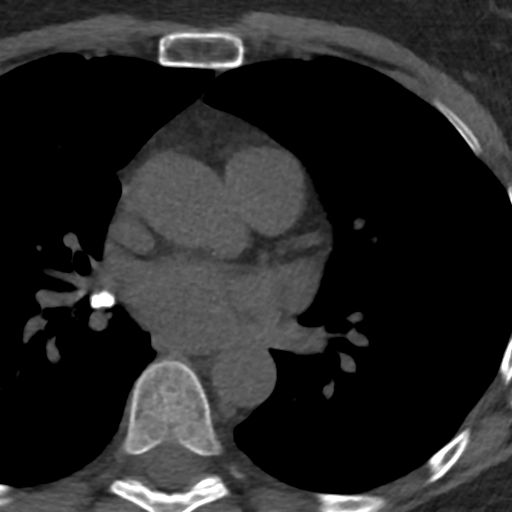

[Series 3: lung 73 % · axial · 0.68mm/px · z∈[-214,-130]mm · 5 of 42 slices shown]
[im 7/42  lung]
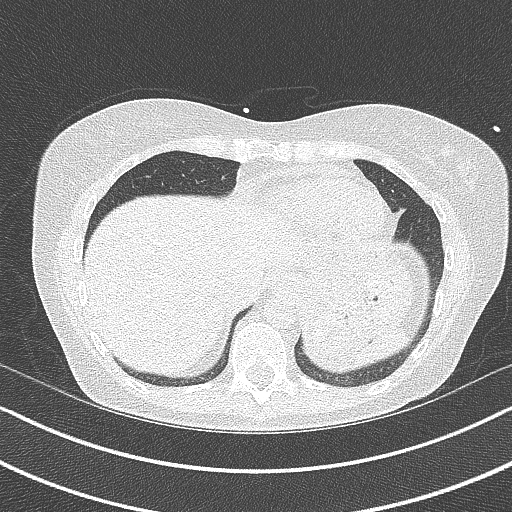
[im 14/42  lung]
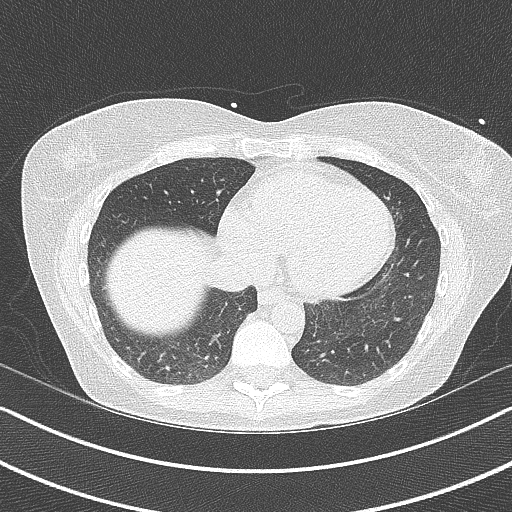
[im 21/42  lung]
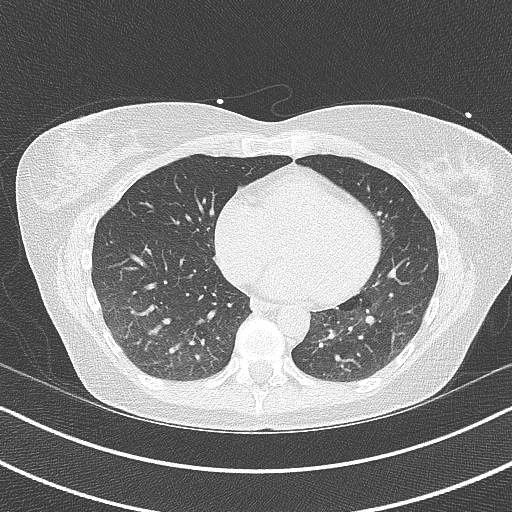
[im 28/42  lung]
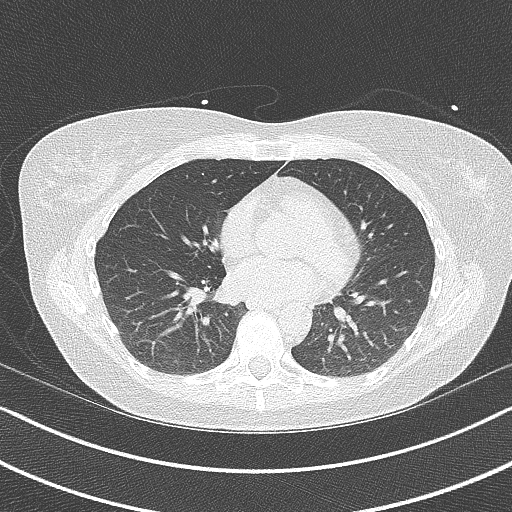
[im 35/42  lung]
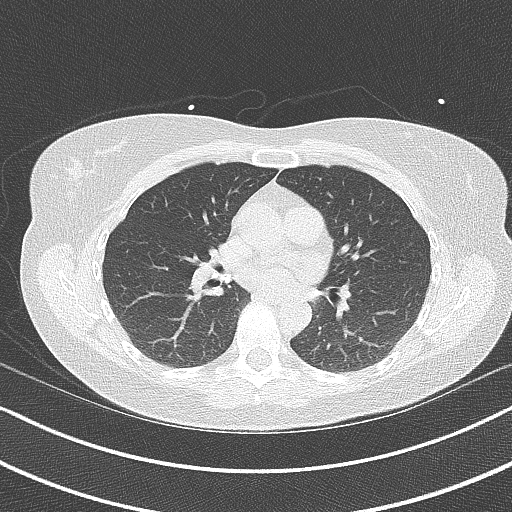

[Series 4: lung st 73 % · axial · 0.68mm/px · z∈[-214,-130]mm · 5 of 42 slices shown]
[im 7/42  lung]
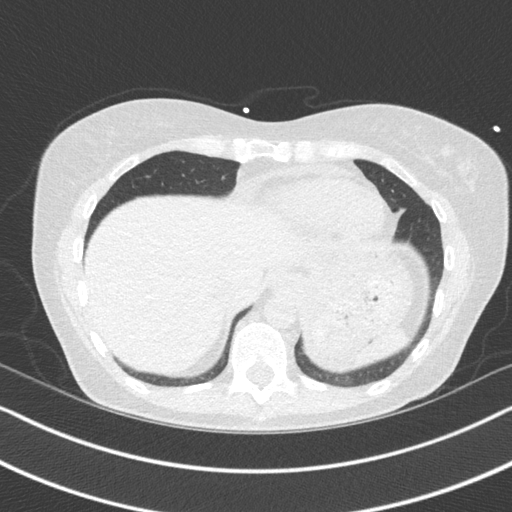
[im 14/42  lung]
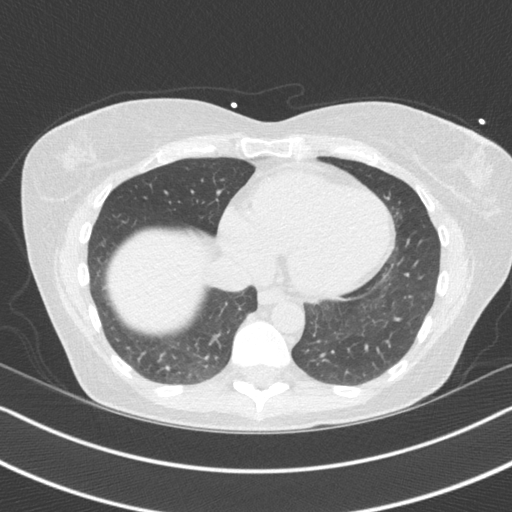
[im 21/42  lung]
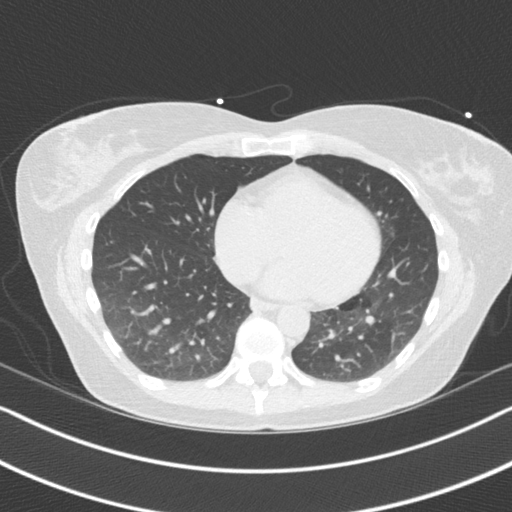
[im 28/42  lung]
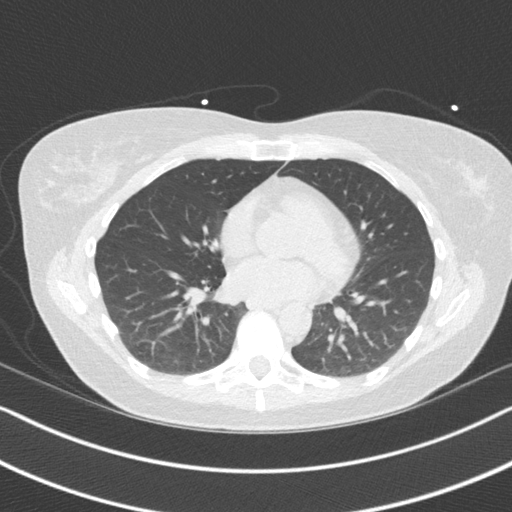
[im 35/42  lung]
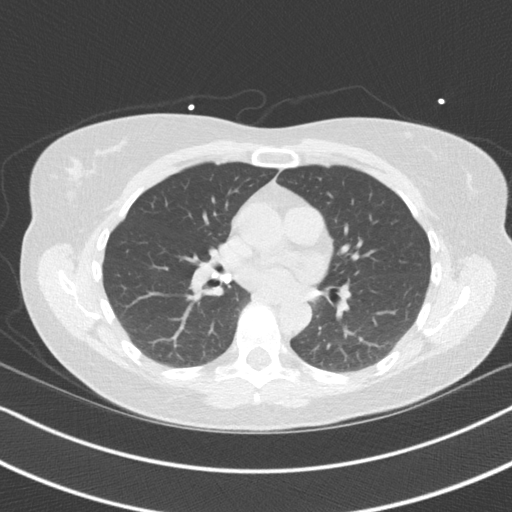

[14 of 20 positions shown; findings below may reference images not displayed]

FINDINGS: Vascular: No significant incidental vascular findings.

Mediastinum/Nodes: There are multiple calcified right hilar lymph
nodes consistent with prior granulomatous disease.

Lungs/Pleura: There are 2 adjacent small calcified granulomata in
the posterior right lower lobe. Visualized lungs show no evidence of
pulmonary edema, consolidation, pneumothorax or pleural fluid.

Upper Abdomen: No acute abnormality.

Musculoskeletal: No chest wall mass or suspicious bone lesions
identified.
IMPRESSION: Evidence of prior granulomatous disease with multiple calcified
right hilar lymph nodes and 2 adjacent small calcified granulomata
in the right lower lobe.
FINDINGS: Non-cardiac: See separate report from [REDACTED].

Ascending Aorta: Normal

Pericardium: Normal

Coronary arteries: Normal origin.
IMPRESSION: Coronary calcium score of 0. This was 0 percentile for age and sex
matched control.

Avellin Sean Justin Brigad,DO

*** End of Addendum ***
EXAM:
OVER-READ INTERPRETATION  CT CHEST

The following report is an over-read performed by radiologist Dr.
over-read does not include interpretation of cardiac or coronary
anatomy or pathology. The coronary calcium score interpretation by
the cardiologist is attached.
FINDINGS: Vascular: No significant incidental vascular findings.

Mediastinum/Nodes: There are multiple calcified right hilar lymph
nodes consistent with prior granulomatous disease.

Lungs/Pleura: There are 2 adjacent small calcified granulomata in
the posterior right lower lobe. Visualized lungs show no evidence of
pulmonary edema, consolidation, pneumothorax or pleural fluid.

Upper Abdomen: No acute abnormality.

Musculoskeletal: No chest wall mass or suspicious bone lesions
identified.
IMPRESSION: Evidence of prior granulomatous disease with multiple calcified
right hilar lymph nodes and 2 adjacent small calcified granulomata
in the right lower lobe.

## 2021-11-22 ENCOUNTER — Ambulatory Visit (INDEPENDENT_AMBULATORY_CARE_PROVIDER_SITE_OTHER): Payer: BC Managed Care – PPO | Admitting: Sports Medicine

## 2021-11-22 ENCOUNTER — Encounter: Payer: Self-pay | Admitting: Sports Medicine

## 2021-11-22 DIAGNOSIS — M779 Enthesopathy, unspecified: Secondary | ICD-10-CM

## 2021-11-22 DIAGNOSIS — M216X2 Other acquired deformities of left foot: Secondary | ICD-10-CM

## 2021-11-22 DIAGNOSIS — M216X1 Other acquired deformities of right foot: Secondary | ICD-10-CM

## 2021-11-22 DIAGNOSIS — M79671 Pain in right foot: Secondary | ICD-10-CM | POA: Diagnosis not present

## 2021-11-22 NOTE — Progress Notes (Signed)
Subjective: Anne Cook is a 61 y.o. female patient who returns to the office for follow-up evaluation of foot pain and states that her feet hurt occasionally gets some pain still along the lateral side of the right foot but it does good as long as she keeps stretching because she knows that her calves are really tight.  Patient reports that she is interested in getting orthotics have been when she was a child but does not remember for what concern.  Patient currently wears supportive shoe but does not use any over-the-counter insoles.  Patient denies any changes in medication or health history since last encounter.     Patient Active Problem List   Diagnosis Date Noted   Leukopenia 02/26/2021   Hyperlipidemia 10/31/2020   PONV (postoperative nausea and vomiting)    GERD (gastroesophageal reflux disease)    Mass of finger of right hand 04/04/2019   Mucoid cyst, joint 04/04/2019   History of gastroesophageal reflux (GERD) 06/02/2016    Current Outpatient Medications on File Prior to Visit  Medication Sig Dispense Refill   estradiol (ESTRACE) 0.1 MG/GM vaginal cream Place vaginally.     Biotin 1000 MCG CHEW Chew 1 tablet by mouth 2 (two) times a week.     Chromium Picolinate (CHROMIUM PICOLATE PO) Take 1 capsule by mouth once a week.     loratadine (CLARITIN) 10 MG tablet Take 1 tablet by mouth daily.     metroNIDAZOLE (METROGEL) 1 % gel Apply 1 application topically daily. Apply daily on nose     Multiple Vitamin (MULTIVITAMIN) capsule Take 1 capsule by mouth every other day.     omeprazole (PRILOSEC) 20 MG capsule Take 1 capsule by mouth daily.     vitamin B-12 (CYANOCOBALAMIN) 100 MCG tablet Take 100 mcg by mouth every other day.     No current facility-administered medications on file prior to visit.    Allergies  Allergen Reactions   Amoxicillin Hives    Thrush    Latex Rash    Objective:  General: Alert and oriented x3 in no acute distress  Dermatology: No open  lesions bilateral lower extremities, no webspace macerations, no ecchymosis bilateral, all nails x 10 are well manicured.  Vascular: Dorsalis Pedis and Posterior Tibial pedal pulses palpable, Capillary Fill Time 3 seconds,(+) pedal hair growth bilateral, no edema bilateral lower extremities, Temperature gradient within normal limits.  Neurology: Johney Maine sensation intact via light touch bilateral.  Musculoskeletal: Minimal tenderness with palpation at right fifth metatarsal base at peroneal brevis insertion, mild hypermobility noted along the first ray with enlarged base/dorsal prominence at midfoot cuneiform joint right greater than left, flexible pes planus foot type.  No pain with calf compression bilateral, range of motion within normal limits bilateral except as mentioned above with hypermobility and ankle joint range of motion consistent with equinus.  Strength within normal limits in all groups bilateral.    Assessment and Plan: Problem List Items Addressed This Visit   None Visit Diagnoses     Tendinitis    -  Primary   Right foot pain       Acquired equinus deformity of both feet            -Complete examination performed -Discussed long-term care for equinus and tendinitis with flexible flatfoot and hypermobility -Patient will be a good candidate for custom functional foot orthotics to control his excessive mobility and to reduce lateral pressure along the right peroneal tendons -Patient to see Aaron Edelman to be casted for  custom functional foot orthotics -Advised patient to continue with daily stretching as previous -Patient to return to office as scheduled or sooner problems or issues arise.  Landis Martins, DPM

## 2021-11-27 ENCOUNTER — Ambulatory Visit (INDEPENDENT_AMBULATORY_CARE_PROVIDER_SITE_OTHER): Payer: BC Managed Care – PPO

## 2021-11-27 ENCOUNTER — Other Ambulatory Visit: Payer: Self-pay

## 2021-11-27 DIAGNOSIS — M216X2 Other acquired deformities of left foot: Secondary | ICD-10-CM | POA: Diagnosis not present

## 2021-11-27 DIAGNOSIS — M216X1 Other acquired deformities of right foot: Secondary | ICD-10-CM

## 2021-11-27 DIAGNOSIS — M779 Enthesopathy, unspecified: Secondary | ICD-10-CM | POA: Diagnosis not present

## 2021-11-27 NOTE — Progress Notes (Signed)
SITUATION Reason for Consult: Evaluation for Bilateral Custom Foot Orthoses Patient / Caregiver Report: Patient is ready for foot orthotics  OBJECTIVE DATA: Patient History / Diagnosis:    ICD-10-CM   1. Tendinitis  M77.9     2. Acquired equinus deformity of both feet  M21.6X1    M21.6X2       Current or Previous Devices: Historical, have not used in some time  Foot Examination: Skin presentation:   Intact Ulcers & Callousing:   None and no history Toe / Foot Deformities:  Pes cavus Weight Bearing Presentation:  cavus Sensation:    Intact  Shoe size 59M  ORTHOTIC RECOMMENDATION Recommended Device: 1x pair of custom functional foot orthotics  GOALS OF ORTHOSES - Reduce Pain - Prevent Foot Deformity - Prevent Progression of Further Foot Deformity - Relieve Pressure - Improve the Overall Biomechanical Function of the Foot and Lower Extremity.  ACTIONS PERFORMED Patient was casted for Foot Orthoses via crush box. Procedure was explained and patient tolerated procedure well. All questions were answered and concerns addressed.  PLAN Potential out of pocket cost was communicated to patient. Casts are to be sent to [facility] for fabrication. Patient is to be called for fitting when devices are ready.

## 2022-01-07 ENCOUNTER — Telehealth: Payer: Self-pay

## 2022-01-07 NOTE — Telephone Encounter (Signed)
Lvm that Aaron Edelman would be working in another location tomorrow so we would need to reschedule her appointment.

## 2022-01-08 ENCOUNTER — Other Ambulatory Visit: Payer: BC Managed Care – PPO

## 2022-01-13 ENCOUNTER — Ambulatory Visit: Payer: BC Managed Care – PPO

## 2022-01-13 DIAGNOSIS — M779 Enthesopathy, unspecified: Secondary | ICD-10-CM

## 2022-01-13 DIAGNOSIS — M216X2 Other acquired deformities of left foot: Secondary | ICD-10-CM

## 2022-01-13 NOTE — Progress Notes (Signed)
SITUATION: ?Reason for Visit: Fitting and Delivery of Custom Fabricated Foot Orthoses ?Patient Report: Patient reports comfort and is satisfied with device. ? ?OBJECTIVE DATA: ?Patient History / Diagnosis:   ?  ICD-10-CM   ?1. Tendinitis  M77.9   ?  ?2. Acquired equinus deformity of both feet  M21.6X1   ? M21.6X2   ?  ? ? ?Provided Device:  Custom Functional Foot Orthotics ?    RicheyLAB: [id number] ? ?GOAL OF ORTHOSIS ?- Improve gait ?- Decrease energy expenditure ?- Improve Balance ?- Provide Triplanar stability of foot complex ?- Facilitate motion ? ?ACTIONS PERFORMED ?Patient was fit with foot orthotics trimmed to shoe last. Patient tolerated fittign procedure.  ? ?Patient was provided with verbal and written instruction and demonstration regarding donning, doffing, wear, care, proper fit, function, purpose, cleaning, and use of the orthosis and in all related precautions and risks and benefits regarding the orthosis. ? ?Patient was also provided with verbal instruction regarding how to report any failures or malfunctions of the orthosis and necessary follow up care. Patient was also instructed to contact our office regarding any change in status that may affect the function of the orthosis. ? ?Patient demonstrated independence with proper donning, doffing, and fit and verbalized understanding of all instructions. ? ?PLAN: ?Patient is to follow up in one week or as necessary (PRN). All questions were answered and concerns addressed. Plan of care was discussed with and agreed upon by the patient. ? ?

## 2022-07-23 ENCOUNTER — Telehealth: Payer: Self-pay | Admitting: Gastroenterology

## 2022-07-23 NOTE — Telephone Encounter (Signed)
Hi Dr. Lyndel Safe,  This patient brought in her and her husband's medical records from when they were last seen in [place] by Dr. Lyda Jester.  The records are being sent to you for your review for a transfer of care.  Please let me know how you wish to proceed.  Thank you.

## 2022-07-24 NOTE — Telephone Encounter (Signed)
Okay to schedule in my clinic or APP clinic RG 

## 2022-07-28 ENCOUNTER — Encounter: Payer: Self-pay | Admitting: Gastroenterology

## 2022-09-23 ENCOUNTER — Encounter: Payer: Self-pay | Admitting: Gastroenterology

## 2022-09-23 ENCOUNTER — Ambulatory Visit: Payer: Managed Care, Other (non HMO) | Admitting: Gastroenterology

## 2022-09-23 VITALS — BP 118/68 | HR 82 | Ht 70.0 in | Wt 136.4 lb

## 2022-09-23 DIAGNOSIS — K219 Gastro-esophageal reflux disease without esophagitis: Secondary | ICD-10-CM

## 2022-09-23 DIAGNOSIS — K449 Diaphragmatic hernia without obstruction or gangrene: Secondary | ICD-10-CM

## 2022-09-23 NOTE — Progress Notes (Signed)
Chief Complaint: For reflux  Referring Provider:  Lise Auer, MD      ASSESSMENT AND PLAN;   #1. GERD with HH  #2.  CRC screening.  Colon due 04/2025  Plan: -EGD for further eval -Continue omeprazole 20mg  po QOD. -Nonpharmacologic means of reflux control. -Discussed various options including PRN PPIs, fundoplication, TIF etc. -Continued follow-up with Dr. for possible osteoporosis.   HPI:    Anne Cook is a 61 y.o. female  With HLD, hysterectomy Accompanied by her husband  And longstanding history of GERD with HH (Dx 1993 on upper GI series) EGD Nov 2009 as below with dilatation.  She has been on omeprazole ever since Was very concerned about osteoporosis.  She was offered fundoplication at that time which she declined. Lately has been taking omeprazole 20 mg every other day with good relief.  If she does not take omeprazole for few days, she starts having breakthrough symptoms. No odynophagia or dysphagia.  No nocturnal symptoms.  No N/V, cough or weight loss  No diarrhea or constipation.  No melena or hematochezia.   Was very much concerned regarding osteoporosis specially on long-term PPIs.  She has follow-up bone density scan scheduled in February 2024  Physically very active.  She has good calcium intake.   Wt Readings from Last 3 Encounters:  09/23/22 136 lb 6.4 oz (61.9 kg)  02/26/21 135 lb 1.6 oz (61.3 kg)  11/05/20 134 lb (60.8 kg)    Previous GI procedures:  EGD 08/15/2008 (Dr. 13/12/2007) -Moderate HH -Irregular Z-line. Bx- neg for Barrett's -Empiric dilatation 54 Fr  UGI series 06/1992 -Small HH   Colon 04/2015: Dr. 05/2015 -Sessile diminutive rectal polyp s/p polypectomy. Bx: Low-grade mild dysplasia/condylomata s/p surgery.  Subsequent flex sigs as below -Diminutive colonic polyp s/p polypectomy (Bx- neg), internal hemorrhoids,  -Rpt full colonoscopy in 10 yrs  FS 04/2017: (Dr. 05/2017 )no residual condylomatous  tissue in the rectum.  Internal and small external hemorrhoids FS1/2017: No residual condylomatous tissue identified.  Internal and external hemorrhoids.  SH: Retired, married, accompanied by her husband. Past Medical History:  Diagnosis Date   GERD (gastroesophageal reflux disease)    Hyperlipidemia 10/31/2020   Mass of finger of right hand 04/04/2019   Mucoid cyst, joint 04/04/2019   PONV (postoperative nausea and vomiting)    hx of N/V with tubal    Past Surgical History:  Procedure Laterality Date   ABDOMINAL HYSTERECTOMY     COLONOSCOPY  04/13/2015   Dr 06/14/2015. Sessile diminutive rectal polyp (1) Diminutive colon polyps (1) Internal hemorrhoids.   CYST EXCISION Right 07/29/2019   Procedure: CYST REMOVAL;  Surgeon: 07/31/2019, MD;  Location: [facility];  Service: Orthopedics;  Laterality: Right;   ESOPHAGOGASTRODUODENOSCOPY  08/15/2005   Dr 13/12/2004. Empiric dilatation with no obvious esophageal stricture. Hiatal hernia. Irregular gastroesophageal junction   FLEXIBLE SIGMOIDOSCOPY  04/17/2017   Dr 06/18/2017. No residual at condylomatous tissue in the rectum. Internal hemorrhoids and small external hemorrhoids.   FLEXIBLE SIGMOIDOSCOPY  11/09/2015   Dr 11/11/2015. No residual at condylomatous tissue identified. Internal hemorrhoids and small external hemorrhoids.   I & D EXTREMITY Right 07/29/2019   Procedure: IRRIGATION AND DEBRIDEMENT EXTREMITY RIGHT SMALL FINGER;  Surgeon: 07/31/2019, MD;  Location: [facility];  Service: Orthopedics;  Laterality: Right;   TUBAL LIGATION      Family History  Problem Relation Age of Onset   Arthritis Mother    Melanoma Mother  Stroke Father    Hyperlipidemia Father    Coronary artery disease Father    Heart attack Father    Cancer Sister    Heart disease Paternal Grandmother    Heart disease Paternal Grandfather    Cerebrovascular Disease Maternal Grandfather     Social History   Tobacco  Use   Smoking status: Never   Smokeless tobacco: Never  Vaping Use   Vaping Use: Never used  Substance Use Topics   Alcohol use: Not Currently    Comment: rarely   Drug use: Never    Current Outpatient Medications  Medication Sig Dispense Refill   Biotin 1000 MCG CHEW Chew 1 tablet by mouth 2 (two) times a week. (Patient not taking: Reported on 09/23/2022)     Chromium Picolinate (CHROMIUM PICOLATE PO) Take 1 capsule by mouth once a week.     estradiol (ESTRACE) 0.1 MG/GM vaginal cream Place vaginally.     loratadine (CLARITIN) 10 MG tablet Take 1 tablet by mouth daily.     metroNIDAZOLE (METROGEL) 1 % gel Apply 1 application topically daily. Apply daily on nose     Multiple Vitamin (MULTIVITAMIN) capsule Take 1 capsule by mouth every other day.     omeprazole (PRILOSEC) 20 MG capsule Take 1 capsule by mouth daily.     vitamin B-12 (CYANOCOBALAMIN) 100 MCG tablet Take 100 mcg by mouth every other day.     No current facility-administered medications for this visit.    Allergies  Allergen Reactions   Amoxicillin Hives    Thrush    Latex Rash    Review of Systems:  Constitutional: Denies fever, chills, diaphoresis, appetite change and fatigue.  HEENT: Denies photophobia, eye pain, redness, hearing loss, ear pain, congestion, sore throat, rhinorrhea, sneezing, mouth sores, neck pain, neck stiffness and tinnitus.   Respiratory: Denies SOB, DOE, cough, chest tightness,  and wheezing.   Cardiovascular: Denies chest pain, palpitations and leg swelling.  Genitourinary: Denies dysuria, urgency, frequency, hematuria, flank pain and difficulty urinating.  Musculoskeletal: Denies myalgias, back pain, joint swelling, arthralgias and gait problem.  Skin: No rash.  Neurological: Denies dizziness, seizures, syncope, weakness, light-headedness, numbness and headaches.  Hematological: Denies adenopathy. Easy bruising, personal or family bleeding history  Psychiatric/Behavioral: No anxiety or  depression     Physical Exam:    BP 118/68   Pulse 82   Ht 5\' 10"  (1.778 m)   Wt 136 lb 6.4 oz (61.9 kg)   SpO2 97%   BMI 19.57 kg/m  Wt Readings from Last 3 Encounters:  09/23/22 136 lb 6.4 oz (61.9 kg)  02/26/21 135 lb 1.6 oz (61.3 kg)  11/05/20 134 lb (60.8 kg)   Constitutional:  Well-developed, in no acute distress. Psychiatric: Normal mood and affect. Behavior is normal. HEENT: Pupils normal.  Conjunctivae are normal. No scleral icterus. Cardiovascular: Normal rate, regular rhythm. No edema Pulmonary/chest: Effort normal and breath sounds normal. No wheezing, rales or rhonchi. Abdominal: Soft, nondistended. Nontender. Bowel sounds active throughout. There are no masses palpable. No hepatomegaly. Rectal: Deferred Neurological: Alert and oriented to person place and time. Skin: Skin is warm and dry. No rashes noted.  Data Reviewed: I have personally reviewed following labs and imaging studies  CBC:    Latest Ref Rng & Units 02/26/2021   12:00 AM  CBC  WBC  5.5      Hemoglobin 12.0 - 16.0 13.3      Hematocrit 36 - 46 39      Platelets 150 -  399 224         This result is from an external source.    CMP:    Latest Ref Rng & Units 02/26/2021   12:00 AM  CMP  BUN 4 - 21 21      Creatinine 0.5 - 1.1 0.9      Sodium 137 - 147 137      Potassium 3.4 - 5.3 3.8      Chloride 99 - 108 104      CO2 13 - 22 29      Calcium 8.7 - 10.7 8.8      Alkaline Phos 25 - 125 61      AST 13 - 35 28      ALT 7 - 35 16         This result is from an external source.       Edman Circle, MD 09/23/2022, 2:36 PM  Cc: Lise Auer, MD

## 2022-09-23 NOTE — Patient Instructions (Addendum)
_______________________________________________________  If you are age 59 or older, your body mass index should be between 23-30. Your Body mass index is 19.57 kg/m. If this is out of the aforementioned range listed, please consider follow up with your Primary Care Provider.  If you are age 24 or younger, your body mass index should be between 19-25. Your Body mass index is 19.57 kg/m. If this is out of the aformentioned range listed, please consider follow up with your Primary Care Provider.   ________________________________________________________  The [facility] providers would like to encourage you to use [facility] to communicate with providers for non-urgent requests or questions.  Due to long hold times on the telephone, sending your provider a message by [facility] may be a faster and more efficient way to get a response.  Please allow 48 business hours for a response.  Please remember that this is for non-urgent requests.  _______________________________________________________  Bonita Quin have been scheduled for an endoscopy. Please follow written instructions given to you at your visit today. If you use inhalers (even only as needed), please bring them with you on the day of your procedure.  Continue current medications  Thank you,  Dr. Lynann Bologna

## 2022-11-03 ENCOUNTER — Telehealth: Payer: Self-pay | Admitting: *Deleted

## 2022-11-03 ENCOUNTER — Encounter: Payer: 59 | Admitting: Gastroenterology

## 2022-11-03 NOTE — Telephone Encounter (Signed)
No problems. Thanks for letting me know. Please reschedule when she is feeling better. RG

## 2022-11-03 NOTE — Telephone Encounter (Signed)
Pt unable to get through to the on call line over the weekend, said she tried Saturday and Sunday and the line just kept ringing. She did send a fax to the office stating that she has had congestion the last couple of days and now she has a cold. She wanted to let us know as soon as possible  that she needed to cancel her EGD and she will call back to reschedule. RN called pt back to let her know that we received her message.

## 2022-12-16 ENCOUNTER — Telehealth: Payer: Self-pay | Admitting: Cardiology

## 2022-12-16 NOTE — Telephone Encounter (Signed)
Called patient and informed her of Dr. Joya Gaskins recommendation below:  "Score was 0 I would not consider repeat for 5 years."  Patient verbalized understanding and had no further questions at this time.

## 2022-12-16 NOTE — Telephone Encounter (Signed)
Pt stated that they normally get a calcium scan, but wasn't sure how often they needed to have it done. Please advise

## 2022-12-16 NOTE — Telephone Encounter (Signed)
Called patient and she stated that she had a calcium score done about 2 years ago and didn't know if she should have another one done at this time. Please advise

## 2022-12-18 ENCOUNTER — Encounter: Payer: 59 | Admitting: Gastroenterology

## 2022-12-23 ENCOUNTER — Ambulatory Visit: Payer: Managed Care, Other (non HMO) | Admitting: Gastroenterology

## 2022-12-23 ENCOUNTER — Encounter: Payer: Self-pay | Admitting: Gastroenterology

## 2022-12-23 VITALS — BP 111/57 | HR 62 | Temp 97.8°F | Resp 10 | Ht 70.0 in | Wt 136.0 lb

## 2022-12-23 DIAGNOSIS — K219 Gastro-esophageal reflux disease without esophagitis: Secondary | ICD-10-CM

## 2022-12-23 DIAGNOSIS — K449 Diaphragmatic hernia without obstruction or gangrene: Secondary | ICD-10-CM

## 2022-12-23 DIAGNOSIS — K2950 Unspecified chronic gastritis without bleeding: Secondary | ICD-10-CM | POA: Diagnosis not present

## 2022-12-23 MED ORDER — SODIUM CHLORIDE 0.9 % IV SOLN: 500.0000 mL | Freq: Once | INTRAVENOUS | Status: DC

## 2022-12-23 NOTE — Progress Notes (Signed)
Pt's states no medical or surgical changes since previsit or office visit. 

## 2022-12-23 NOTE — Op Note (Signed)
[facility] Patient Name: Anne Cook Procedure Date: 12/23/2022 9:28 AM MRN: [id number] Endoscopist: Jackquline Denmark , MD, [id number] Age: 62 Referring MD:  Date of Birth: [birth year] Gender: Female Account #: [id number] Procedure:                Upper GI endoscopy Indications:              GERD Medicines:                Monitored Anesthesia Care Procedure:                Pre-Anesthesia Assessment:                           - Prior to the procedure, a History and Physical                            was performed, and patient medications and                            allergies were reviewed. The patient's tolerance of                            previous anesthesia was also reviewed. The risks                            and benefits of the procedure and the sedation                            options and risks were discussed with the patient.                            All questions were answered, and informed consent                            was obtained. Prior Anticoagulants: The patient has                            taken no anticoagulant or antiplatelet agents. ASA                            Grade Assessment: II - A patient with mild systemic                            disease. After reviewing the risks and benefits,                            the patient was deemed in satisfactory condition to                            undergo the procedure.                           After obtaining informed consent, the endoscope was  passed under direct vision. Throughout the                            procedure, the patient's blood pressure, pulse, and                            oxygen saturations were monitored continuously. The                            GIF [id number] was introduced through the                            mouth, and advanced to the second part of duodenum.                            The upper GI endoscopy was accomplished without                             difficulty. The patient tolerated the procedure                            well. Scope In: Scope Out: Findings:                 The lower third of the esophagus was mildly                            tortuous but normal with well-defined Z-line at 36                            cm.                           A small (2 cm) hiatal hernia was present.                           The entire examined stomach was normal. Biopsies                            were taken with a cold forceps for histology.                           The examined duodenum was normal. Complications:            No immediate complications. Estimated Blood Loss:     Estimated blood loss: none. Impression:               - Small hiatal hernia.                           - Otherwise normal EGD. Recommendation:           - Patient has a contact number available for                            emergencies. The signs and symptoms of potential  delayed complications were discussed with the                            patient. Return to normal activities tomorrow.                            Written discharge instructions were provided to the                            patient.                           - Resume previous diet.                           - Continue present medications.                           - Await pathology results.                           - Brochures regarding reflux.                           - The findings and recommendations were discussed                            with the patient's family. Jackquline Denmark, MD 12/23/2022 9:53:36 AM This report has been signed electronically.

## 2022-12-23 NOTE — Progress Notes (Signed)
Called to room to assist during endoscopic procedure.  Patient ID and intended procedure confirmed with present staff. Received instructions for my participation in the procedure from the performing physician.  

## 2022-12-23 NOTE — Progress Notes (Signed)
Patient mostly recovered in procedure room.

## 2022-12-23 NOTE — Progress Notes (Signed)
Uneventful anesthetic. Report to pacu rn. Vss. Care resumed by rn. 

## 2022-12-23 NOTE — Patient Instructions (Signed)
YOU HAD AN ENDOSCOPIC PROCEDURE TODAY AT THE [facility]:   Refer to the procedure report that was given to you for any specific questions about what was found during the examination.  If the procedure report does not answer your questions, please call your gastroenterologist to clarify.  If you requested that your care partner not be given the details of your procedure findings, then the procedure report has been included in a sealed envelope for you to review at your convenience later.  YOU SHOULD EXPECT: Some feelings of bloating in the abdomen. Passage of more gas than usual.  Walking can help get rid of the air that was put into your GI tract during the procedure and reduce the bloating. If you had a lower endoscopy (such as a colonoscopy or flexible sigmoidoscopy) you may notice spotting of blood in your stool or on the toilet paper. If you underwent a bowel prep for your procedure, you may not have a normal bowel movement for a few days.  Please Note:  You might notice some irritation and congestion in your nose or some drainage.  This is from the oxygen used during your procedure.  There is no need for concern and it should clear up in a day or so.  SYMPTOMS TO REPORT IMMEDIATELY:   Following upper endoscopy (EGD)  Vomiting of blood or coffee ground material  New chest pain or pain under the shoulder blades  Painful or persistently difficult swallowing  New shortness of breath  Fever of 100F or higher  Black, tarry-looking stools  For urgent or emergent issues, a gastroenterologist can be reached at any hour by calling [phone]. Do not use MyChart messaging for urgent concerns.    DIET:  We do recommend a small meal at first, but then you may proceed to your regular diet.  Drink plenty of fluids but you should avoid alcoholic beverages for 24 hours.  ACTIVITY:  You should plan to take it easy for the rest of today and you should NOT DRIVE or use heavy machinery  until tomorrow (because of the sedation medicines used during the test).    FOLLOW UP: Our staff will call the number listed on your records the next business day following your procedure.  We will call around 7:15- 8:00 am to check on you and address any questions or concerns that you may have regarding the information given to you following your procedure. If we do not reach you, we will leave a message.     If any biopsies were taken you will be contacted by phone or by letter within the next 1-3 weeks.  Please call us at [phone] if you have not heard about the biopsies in 3 weeks.    SIGNATURES/CONFIDENTIALITY: You and/or your care partner have signed paperwork which will be entered into your electronic medical record.  These signatures attest to the fact that that the information above on your After Visit Summary has been reviewed and is understood.  Full responsibility of the confidentiality of this discharge information lies with you and/or your care-partner. 

## 2022-12-23 NOTE — Progress Notes (Signed)
Chief Complaint: For reflux  Referring Provider:  Mateo Flow, MD      ASSESSMENT AND PLAN;   #1. GERD with [place]  #2.  CRC screening.  Colon due 04/2025  Plan: -EGD for further eval -Continue omeprazole '20mg'$  po QOD. -Nonpharmacologic means of reflux control. -Discussed various options including PRN PPIs, fundoplication, TIF etc. -Continued follow-up with Dr. Chancy Milroy for possible osteoporosis.   HPI:    Anne Cook is a 62 y.o. female  With HLD, hysterectomy Accompanied by her husband  And longstanding history of GERD with HH (Dx 1993 on upper GI series) EGD Nov 2009 as below with dilatation.  She has been on omeprazole ever since Was very concerned about osteoporosis.  She was offered fundoplication at that time which she declined. Lately has been taking omeprazole 20 mg every other day with good relief.  If she does not take omeprazole for few days, she starts having breakthrough symptoms. No odynophagia or dysphagia.  No nocturnal symptoms.  No N/V, cough or weight loss  No diarrhea or constipation.  No melena or hematochezia.   Was very much concerned regarding osteoporosis specially on long-term PPIs.  She has follow-up bone density scan scheduled in February 2024  Physically very active.  She has good calcium intake.   Wt Readings from Last 3 Encounters:  12/23/22 136 lb (61.7 kg)  09/23/22 136 lb 6.4 oz (61.9 kg)  02/26/21 135 lb 1.6 oz (61.3 kg)    Previous GI procedures:  EGD 08/15/2008 (Dr. Lyda Jester) -Moderate HH -Irregular Z-line. Bx- neg for Barrett's -Empiric dilatation 10 Fr  UGI series 06/1992 -Small HH   Colon 04/2015: Dr. Lyda Jester -Sessile diminutive rectal polyp s/p polypectomy. Bx: Low-grade mild dysplasia/condylomata s/p surgery.  Subsequent flex sigs as below -Diminutive colonic polyp s/p polypectomy (Bx- neg), internal hemorrhoids,  -Rpt full colonoscopy in 10 yrs  FS 04/2017: (Dr. Lyda Jester )no residual condylomatous  tissue in the rectum.  Internal and small external hemorrhoids FS1/2017: No residual condylomatous tissue identified.  Internal and external hemorrhoids.  SH: Retired, married, accompanied by her husband. Past Medical History:  Diagnosis Date   GERD (gastroesophageal reflux disease)    Hyperlipidemia 10/31/2020   Mass of finger of right hand 04/04/2019   Mucoid cyst, joint 04/04/2019   PONV (postoperative nausea and vomiting)    hx of N/V with tubal    Past Surgical History:  Procedure Laterality Date   ABDOMINAL HYSTERECTOMY     COLONOSCOPY  04/13/2015   Dr Lyda Jester. Sessile diminutive rectal polyp (1) Diminutive colon polyps (1) Internal hemorrhoids.   CYST EXCISION Right 07/29/2019   Procedure: CYST REMOVAL;  Surgeon: Daryll Brod, MD;  Location: [facility];  Service: Orthopedics;  Laterality: Right;   ESOPHAGOGASTRODUODENOSCOPY  08/15/2005   Dr Lyda Jester. Empiric dilatation with no obvious esophageal stricture. Hiatal hernia. Irregular gastroesophageal junction   FLEXIBLE SIGMOIDOSCOPY  04/17/2017   Dr Lyda Jester. No residual at condylomatous tissue in the rectum. Internal hemorrhoids and small external hemorrhoids.   FLEXIBLE SIGMOIDOSCOPY  11/09/2015   Dr Lyda Jester. No residual at condylomatous tissue identified. Internal hemorrhoids and small external hemorrhoids.   I & D EXTREMITY Right 07/29/2019   Procedure: IRRIGATION AND DEBRIDEMENT EXTREMITY RIGHT SMALL FINGER;  Surgeon: Daryll Brod, MD;  Location: [facility];  Service: Orthopedics;  Laterality: Right;   TUBAL LIGATION      Family History  Problem Relation Age of Onset   Arthritis Mother    Melanoma Mother  Stroke Father    Hyperlipidemia Father    Coronary artery disease Father    Heart attack Father    Cancer Sister    Heart disease Paternal Grandmother    Heart disease Paternal Grandfather    Cerebrovascular Disease Maternal Grandfather     Social History   Tobacco  Use   Smoking status: Never   Smokeless tobacco: Never  Vaping Use   Vaping Use: Never used  Substance Use Topics   Alcohol use: Not Currently    Comment: rarely   Drug use: Never    Current Outpatient Medications  Medication Sig Dispense Refill   Chromium Picolinate (CHROMIUM PICOLATE PO) Take 1 capsule by mouth once a week.     estradiol (ESTRACE) 0.1 MG/GM vaginal cream Place vaginally.     loratadine (CLARITIN) 10 MG tablet Take 1 tablet by mouth daily.     melatonin 3 MG TABS tablet Take by mouth.     metroNIDAZOLE (METROGEL) 1 % gel Apply 1 application topically daily. Apply daily on nose     Multiple Vitamin (MULTIVITAMIN) capsule Take 1 capsule by mouth every other day.     omeprazole (PRILOSEC) 20 MG capsule Take 1 capsule by mouth daily.     vitamin B-12 (CYANOCOBALAMIN) 100 MCG tablet Take 100 mcg by mouth every other day.     Biotin 1000 MCG CHEW Chew 1 tablet by mouth 2 (two) times a week. (Patient not taking: Reported on 09/23/2022)     No current facility-administered medications for this visit.    Allergies  Allergen Reactions   Amoxicillin Hives    Thrush    Latex Rash    Review of Systems:  Constitutional: Denies fever, chills, diaphoresis, appetite change and fatigue.  HEENT: Denies photophobia, eye pain, redness, hearing loss, ear pain, congestion, sore throat, rhinorrhea, sneezing, mouth sores, neck pain, neck stiffness and tinnitus.   Respiratory: Denies SOB, DOE, cough, chest tightness,  and wheezing.   Cardiovascular: Denies chest pain, palpitations and leg swelling.  Genitourinary: Denies dysuria, urgency, frequency, hematuria, flank pain and difficulty urinating.  Musculoskeletal: Denies myalgias, back pain, joint swelling, arthralgias and gait problem.  Skin: No rash.  Neurological: Denies dizziness, seizures, syncope, weakness, light-headedness, numbness and headaches.  Hematological: Denies adenopathy. Easy bruising, personal or family bleeding  history  Psychiatric/Behavioral: No anxiety or depression     Physical Exam:    BP (!) 111/57   Pulse 62   Temp 97.8 F (36.6 C)   Resp 10   Ht '5\' 10"'$  (1.778 m)   Wt 136 lb (61.7 kg)   SpO2 99%   BMI 19.51 kg/m  Wt Readings from Last 3 Encounters:  12/23/22 136 lb (61.7 kg)  09/23/22 136 lb 6.4 oz (61.9 kg)  02/26/21 135 lb 1.6 oz (61.3 kg)   Constitutional:  Well-developed, in no acute distress. Psychiatric: Normal mood and affect. Behavior is normal. HEENT: Pupils normal.  Conjunctivae are normal. No scleral icterus. Cardiovascular: Normal rate, regular rhythm. No edema Pulmonary/chest: Effort normal and breath sounds normal. No wheezing, rales or rhonchi. Abdominal: Soft, nondistended. Nontender. Bowel sounds active throughout. There are no masses palpable. No hepatomegaly. Rectal: Deferred Neurological: Alert and oriented to person place and time. Skin: Skin is warm and dry. No rashes noted.  Data Reviewed: I have personally reviewed following labs and imaging studies  CBC:    Latest Ref Rng & Units 02/26/2021   12:00 AM  CBC  WBC  5.5  Hemoglobin 12.0 - 16.0 13.3      Hematocrit 36 - 46 39      Platelets 150 - 399 224         This result is from an external source.    CMP:    Latest Ref Rng & Units 02/26/2021   12:00 AM  CMP  BUN 4 - 21 21      Creatinine 0.5 - 1.1 0.9      Sodium 137 - 147 137      Potassium 3.4 - 5.3 3.8      Chloride 99 - 108 104      CO2 13 - 22 29      Calcium 8.7 - 10.7 8.8      Alkaline Phos 25 - 125 61      AST 13 - 35 28      ALT 7 - 35 16         This result is from an external source.       Carmell Austria, MD 12/23/2022, 3:21 PM  Cc: Mateo Flow, MD

## 2022-12-24 ENCOUNTER — Telehealth: Payer: Self-pay | Admitting: *Deleted

## 2022-12-24 NOTE — Telephone Encounter (Signed)
No answer on  follow up call. Left message.   

## 2022-12-28 ENCOUNTER — Encounter: Payer: Self-pay | Admitting: Gastroenterology
